# Patient Record
Sex: Female | Born: 1975 | Race: Black or African American | Hispanic: No | Smoking: Never smoker
Health system: Southern US, Community
[De-identification: ages and names within clinical notes are randomized; demographics above are authoritative.]

## PROBLEM LIST (undated history)

## (undated) DIAGNOSIS — E7800 Pure hypercholesterolemia, unspecified: Secondary | ICD-10-CM

## (undated) HISTORY — PX: TUMOR REMOVAL: SHX12

## (undated) HISTORY — PX: BREAST CYST ASPIRATION: SHX578

## (undated) HISTORY — DX: Pure hypercholesterolemia, unspecified: E78.00

---

## 1998-05-14 ENCOUNTER — Other Ambulatory Visit: Admission: RE | Admit: 1998-05-14 | Discharge: 1998-05-14 | Payer: Self-pay | Admitting: Obstetrics and Gynecology

## 1998-06-18 ENCOUNTER — Encounter: Payer: Self-pay | Admitting: Urology

## 1998-06-18 ENCOUNTER — Ambulatory Visit (HOSPITAL_COMMUNITY): Admission: RE | Admit: 1998-06-18 | Discharge: 1998-06-18 | Payer: Self-pay | Admitting: Urology

## 1998-12-23 ENCOUNTER — Other Ambulatory Visit: Admission: RE | Admit: 1998-12-23 | Discharge: 1998-12-23 | Payer: Self-pay | Admitting: Urology

## 1999-04-13 ENCOUNTER — Other Ambulatory Visit: Admission: RE | Admit: 1999-04-13 | Discharge: 1999-04-13 | Payer: Self-pay | Admitting: Obstetrics and Gynecology

## 2002-07-02 ENCOUNTER — Other Ambulatory Visit: Admission: RE | Admit: 2002-07-02 | Discharge: 2002-07-02 | Payer: Self-pay | Admitting: Obstetrics and Gynecology

## 2003-07-07 ENCOUNTER — Other Ambulatory Visit: Admission: RE | Admit: 2003-07-07 | Discharge: 2003-07-07 | Payer: Self-pay | Admitting: Obstetrics and Gynecology

## 2003-11-23 ENCOUNTER — Ambulatory Visit: Payer: Self-pay | Admitting: Psychiatry

## 2003-11-23 ENCOUNTER — Other Ambulatory Visit (HOSPITAL_COMMUNITY): Admission: RE | Admit: 2003-11-23 | Discharge: 2004-02-21 | Payer: Self-pay | Admitting: Psychiatry

## 2004-08-18 ENCOUNTER — Other Ambulatory Visit: Admission: RE | Admit: 2004-08-18 | Discharge: 2004-08-18 | Payer: Self-pay | Admitting: Obstetrics and Gynecology

## 2004-08-31 ENCOUNTER — Ambulatory Visit: Payer: Self-pay | Admitting: Internal Medicine

## 2004-10-03 ENCOUNTER — Ambulatory Visit: Payer: Self-pay | Admitting: Internal Medicine

## 2004-10-10 ENCOUNTER — Ambulatory Visit: Payer: Self-pay | Admitting: Internal Medicine

## 2005-08-23 ENCOUNTER — Other Ambulatory Visit: Admission: RE | Admit: 2005-08-23 | Discharge: 2005-08-23 | Payer: Self-pay | Admitting: Obstetrics and Gynecology

## 2006-12-02 ENCOUNTER — Emergency Department (HOSPITAL_COMMUNITY): Admission: EM | Admit: 2006-12-02 | Discharge: 2006-12-02 | Payer: Self-pay | Admitting: Emergency Medicine

## 2007-12-24 ENCOUNTER — Telehealth: Payer: Self-pay | Admitting: Internal Medicine

## 2008-12-25 ENCOUNTER — Encounter: Admission: RE | Admit: 2008-12-25 | Discharge: 2008-12-25 | Payer: Self-pay | Admitting: Family Medicine

## 2011-01-06 LAB — URINALYSIS, ROUTINE W REFLEX MICROSCOPIC
Bilirubin Urine: NEGATIVE
Glucose, UA: NEGATIVE
Ketones, ur: NEGATIVE
Leukocytes, UA: NEGATIVE
Nitrite: NEGATIVE
Protein, ur: NEGATIVE
Specific Gravity, Urine: 1.02
Urobilinogen, UA: 0.2
pH: 6.5

## 2011-01-06 LAB — WET PREP, GENITAL
Trich, Wet Prep: NONE SEEN
Yeast Wet Prep HPF POC: NONE SEEN

## 2011-01-06 LAB — PREGNANCY, URINE: Preg Test, Ur: NEGATIVE

## 2011-01-06 LAB — URINE MICROSCOPIC-ADD ON

## 2012-01-01 ENCOUNTER — Ambulatory Visit: Payer: Self-pay | Admitting: Obstetrics and Gynecology

## 2012-03-09 ENCOUNTER — Ambulatory Visit (INDEPENDENT_AMBULATORY_CARE_PROVIDER_SITE_OTHER): Payer: BC Managed Care – PPO | Admitting: Emergency Medicine

## 2012-03-09 VITALS — BP 119/73 | HR 97 | Temp 98.0°F | Resp 18 | Ht 67.0 in | Wt 151.0 lb

## 2012-03-09 DIAGNOSIS — J018 Other acute sinusitis: Secondary | ICD-10-CM

## 2012-03-09 DIAGNOSIS — J209 Acute bronchitis, unspecified: Secondary | ICD-10-CM

## 2012-03-09 MED ORDER — HYDROCOD POLST-CHLORPHEN POLST 10-8 MG/5ML PO LQCR: 5.0000 mL | Freq: Two times a day (BID) | ORAL | Status: DC | PRN

## 2012-03-09 MED ORDER — PSEUDOEPHEDRINE-GUAIFENESIN ER 60-600 MG PO TB12: 1.0000 | ORAL_TABLET | Freq: Two times a day (BID) | ORAL | Status: AC

## 2012-03-09 MED ORDER — AMOXICILLIN-POT CLAVULANATE 875-125 MG PO TABS: 1.0000 | ORAL_TABLET | Freq: Two times a day (BID) | ORAL | Status: DC

## 2012-03-09 NOTE — Progress Notes (Signed)
[facility] [address] [place] [address] [phone]  Date:  03/09/2012   Name:  Faith Hodges   DOB:  1976/03/19   MRN:  [id number]  PCP:  No primary provider on file.    Chief Complaint: URI   History of Present Illness:  Faith Hodges is a 36 y.o. very pleasant female patient who presents with the following:  Ill all week with muscle aches and joint pains.  Has chills, no fever documented.  Has nasal congestion and post nasal purulent drainage and a cough that has been largely dry but has morning purulent sputum.  No nausea or vomiting.  No wheezing or shortness of breath.  Has a headache no sore throat.  Not improved on OTC medications.  Cough is interfering with sleep.  Non smoker.  There is no problem list on file for this patient.   Past Medical History  Diagnosis Date  . Elevated cholesterol     Past Surgical History  Procedure Date  . Tumor removal     lt knee    History  Substance Use Topics  . Smoking status: Never Smoker   . Smokeless tobacco: Never Used  . Alcohol Use: No    No family history on file.  Allergies  Allergen Reactions  . Shellfish Allergy     Medication list has been reviewed and updated.  No current outpatient prescriptions on file prior to visit.    Review of Systems:  As per HPI, otherwise negative.  Marland Kitchen  Physical Examination: Filed Vitals:   03/09/12 1118  BP: 119/73  Pulse: 97  Temp: 98 F (36.7 C)  Resp: 18   Filed Vitals:   03/09/12 1118  Height: 5\' 7"  (1.702 m)  Weight: 151 lb (68.493 kg)   Body mass index is 23.65 kg/(m^2). Ideal Body Weight: Weight in (lb) to have BMI = 25: 159.3   GEN: WDWN, NAD, Non-toxic, A & O x 3  No rash or sepsis or shortness of breath HEENT: Atraumatic, Normocephalic. Neck supple. No masses, No LAD.  Oropharynx negative Ears and Nose: No external deformity.  TM negative right left obscured by cerumen.   CV: RRR, No M/G/R. No JVD. No thrill. No extra  heart sounds. PULM: CTA B, no wheezes, crackles, rhonchi. No retractions. No resp. distress. No accessory muscle use. ABD: S, NT, ND, +BS. No rebound. No HSM. EXTR: No c/c/e NEURO Normal gait.  PSYCH: Normally interactive. Conversant. Not depressed or anxious appearing.  Calm demeanor.    Assessment and Plan: Sinusitis Bronchitis augmentin mucinex d tussionex Follow up as needed  Carmelina Dane, MD

## 2012-03-26 ENCOUNTER — Ambulatory Visit (INDEPENDENT_AMBULATORY_CARE_PROVIDER_SITE_OTHER): Payer: BC Managed Care – PPO | Admitting: Obstetrics and Gynecology

## 2012-03-26 ENCOUNTER — Encounter: Payer: Self-pay | Admitting: Obstetrics and Gynecology

## 2012-03-26 VITALS — BP 110/62 | HR 68 | Resp 16 | Ht 67.0 in | Wt 156.0 lb

## 2012-03-26 DIAGNOSIS — Z01419 Encounter for gynecological examination (general) (routine) without abnormal findings: Secondary | ICD-10-CM

## 2012-03-26 DIAGNOSIS — N943 Premenstrual tension syndrome: Secondary | ICD-10-CM

## 2012-03-26 NOTE — Progress Notes (Signed)
AEX Last Pap: 01/13/2010 WNL: Yes Regular Periods:yes Contraception: None  Monthly Breast exam:yes Tetanus<46yrs:yes Nl.Bladder Function:yes Daily BMs:yes Healthy Diet:yes Calcium:no Mammogram:no Date of Mammogram: N/A Exercise:yes Have often Exercise: 3-4 x wk Seatbelt: yes Abuse at home: no Stressful work:yes Sigmoid-colonoscopy: 2006 Benign Polyps Bone Density: No PCP: Laurann Montana Change in PMH: Bursitis in Left shoulder Change in ZOX:WRUE  Subjective:    Faith Hodges is a 36 y.o. female, No obstetric history on file., who presents for an annual exam. Only complaint is PMS moodiness 5 days prior to menses.  Has had difficulty with this previously.  Also had problems with depression in past. May want to consider pregnancy    History   Social History  . Marital Status: Legally Separated    Spouse Name: N/A    Number of Children: N/A  . Years of Education: N/A   Social History Main Topics  . Smoking status: Never Smoker   . Smokeless tobacco: Never Used  . Alcohol Use: No  . Drug Use: No  . Sexually Active: Yes    Birth Control/ Protection: None   Other Topics Concern  . None   Social History Narrative  . None    Menstrual cycle:   LMP: Patient's last menstrual period was 03/05/2012.           Cycle: regular No IM bleeding. No cramps  The following portions of the patient's history were reviewed and updated as appropriate: allergies, current medications, past family history, past medical history, past social history, past surgical history and problem list.  Review of Systems Pertinent items are noted in HPI. Breast:Negative for breast lump,nipple discharge or nipple retraction Gastrointestinal: Negative for abdominal pain, change in bowel habits or rectal bleeding Urinary:negative   Objective:    LMP 03/05/2012    Weight:  Wt Readings from Last 1 Encounters:  03/09/12 151 lb (68.493 kg)          BMI: There is no height or weight on file to  calculate BMI.  General Appearance: Alert, appropriate appearance for age. No acute distress HEENT: Grossly normal Neck / Thyroid: Supple, no masses, nodes or enlargement Lungs: clear to auscultation bilaterally Back: No CVA tenderness Breast Exam: No masses or nodes.No dimpling, nipple retraction or discharge. Cardiovascular: Regular rate and rhythm. S1, S2, no murmur Gastrointestinal: Soft, non-tender, no masses or organomegaly Pelvic Exam: Vulva and vagina appear normal. Bimanual exam reveals normal uterus and adnexa. Rectovaginal: normal rectal, no masses Lymphatic Exam: Non-palpable nodes in neck, clavicular, axillary, or inguinal regions Skin: no rash or abnormalities Neurologic: Normal gait and speech, no tremor  Psychiatric: Alert and oriented, appropriate affect.   Wet Prep:not applicable Urinalysis:not applicable UPT: Not done   Assessment:    Normal gyn exam PMS    Plan:  Written suggestions for managing PMS  Without medication initially.  Pt will call if sx persist  pap smear due 2014 return annually or prn STD screening: declined Contraception:no method: declines  Dierdre Forth MD

## 2012-03-26 NOTE — Patient Instructions (Signed)
Premenstrual Syndrome Premenstrual syndrome (PMS) or premenstrual disphoric disorder (PMDD) is a mix of emotional and clinical symptoms. PMS occurs 10 to 14 days before the start of a menstrual period. Common symptoms include pelvic pain, headache and mood changes. Most women have PMS to some degree.  CAUSES  The cause is unknown. There is evidence that it is related to the female hormones during the second half of the menstrual cycle. These hormones fluctuate and are thought to affect chemicals in the brain (serotonin) that can influence a person's mood. SYMPTOMS  Symptoms may include any of the following:  Headache.  Swelling of hands and feet.  Abdominal bloating.  Tiredness.  Breast tenderness.  Depression.  Crying spells.  Anxiety.  Irritability.  Confusion.  Joint and muscle pains.  Forgetfulness.  Withdrawal from family, friends and activities. DIAGNOSIS  Diagnosis is made by your caregiver who will ask you questions about the kind of symptoms you are having, when they occur and what may bring them on. If your are having any of the symptoms listed above that occur 10 to 14 days before your menstrual period, it is strong evidence you have PMS. TREATMENT   Only take over-the-counter or prescription medicines for pain, discomfort or fever as directed by your caregiver.  Oral contraceptives.  Hormone therapy.  Medications that slow down the production of serotonin in the brain (fluoxetine, sertraline and others).  Diuretics. These get rid of extra fluid from your body.  Anti-depression medication when necessary.  Surgery to remove both ovaries. This is a last resort and if no further pregnancies are wanted.  Consider counseling or joining a PMS therapy support group. HOME CARE INSTRUCTIONS   Exercise regularly as suggested by your caregiver. Exercise especially before your menstrual period.  Eat a regular, well-balanced diet rich in carbohydrates.  Restrict  or eliminate caffeine, alcohol and tobacco consumption.  Be sure to get enough sleep. Practice relaxation techniques.  Drink 64 oz. fluids per day. This is 8 glasses, 8 oz. each. It is best to drink water.  Eliminate known stressors in your life.  Attend relationship or parenting counseling, if needed.  Take a multi-vitamin in the recommended daily dosages.  Calcium, magnesium, vitamin B6 and vitamin E are some times helpful for PMS symptoms.  Take medications as suggested by your caregiver. SEEK MEDICAL CARE IF:   You need medication for excessive swelling, depression, severe headaches, or because you cannot sleep.  You need help from your caregiver to help you decide if you need to have your ovaries removed because none of your treatment is helping you. Document Released: 03/10/2000 Document Revised: 06/05/2011 Document Reviewed: 07/31/2011 [facility] Patient Information 2013 [place], [place].

## 2013-07-29 ENCOUNTER — Other Ambulatory Visit: Payer: Self-pay | Admitting: Obstetrics and Gynecology

## 2013-07-29 DIAGNOSIS — Z1231 Encounter for screening mammogram for malignant neoplasm of breast: Secondary | ICD-10-CM

## 2013-08-12 ENCOUNTER — Ambulatory Visit: Payer: Self-pay

## 2013-08-27 ENCOUNTER — Ambulatory Visit
Admission: RE | Admit: 2013-08-27 | Discharge: 2013-08-27 | Disposition: A | Discharge status: Home / Self Care | Payer: No Typology Code available for payment source | Source: Ambulatory Visit | Attending: Obstetrics and Gynecology | Admitting: Obstetrics and Gynecology

## 2013-08-27 DIAGNOSIS — Z1231 Encounter for screening mammogram for malignant neoplasm of breast: Secondary | ICD-10-CM

## 2013-09-10 ENCOUNTER — Ambulatory Visit (INDEPENDENT_AMBULATORY_CARE_PROVIDER_SITE_OTHER): Payer: BC Managed Care – PPO | Admitting: Emergency Medicine

## 2013-09-10 VITALS — BP 104/66 | HR 76 | Temp 98.1°F | Resp 12 | Ht 67.0 in | Wt 155.4 lb

## 2013-09-10 DIAGNOSIS — H6980 Other specified disorders of Eustachian tube, unspecified ear: Secondary | ICD-10-CM

## 2013-09-10 MED ORDER — PSEUDOEPHEDRINE-GUAIFENESIN ER 60-600 MG PO TB12: 1.0000 | ORAL_TABLET | Freq: Two times a day (BID) | ORAL | Status: AC

## 2013-09-10 MED ORDER — TRIAMCINOLONE ACETONIDE 55 MCG/ACT NA AERO: 2.0000 | INHALATION_SPRAY | Freq: Every day | NASAL | Status: DC

## 2013-09-10 NOTE — Progress Notes (Signed)
[facility] [address], [place] [id number] [phone]  Date:  09/10/2013   Name:  Faith Hodges   DOB:  04/09/1975   MRN:  [id number]  PCP:  Cala BradfordWHITE,CYNTHIA S, MD    Chief Complaint: Otalgia   History of Present Illness:  Faith Hodges is a 38 y.o. very pleasant female patient who presents with the following:  Pain and decreased ear on left today. Concerned it may be blocked with wax.  Uses ear plugs at night due husband's snoring.  No fever or chills, coryza or cough.  No improvement with over the counter medications or other home remedies. Denies other complaint or health concern today.   Patient Active Problem List   Diagnosis Date Noted  . PMS (premenstrual syndrome) 03/26/2012    Past Medical History  Diagnosis Date  . Elevated cholesterol     Past Surgical History  Procedure Laterality Date  . Tumor removal      lt knee    History  Substance Use Topics  . Smoking status: Never Smoker   . Smokeless tobacco: Never Used  . Alcohol Use: No    Family History  Problem Relation Age of Onset  . Heart disease Mother     Allergies  Allergen Reactions  . Shellfish Allergy     Medication list has been reviewed and updated.  No current outpatient prescriptions on file prior to visit.   No current facility-administered medications on file prior to visit.    Review of Systems:  As per HPI, otherwise negative.    Physical Examination: Filed Vitals:   09/10/13 2025  BP: 104/66  Pulse: 76  Temp: 98.1 F (36.7 C)  Resp: 12   Filed Vitals:   09/10/13 2025  Height: 5\' 7"  (1.702 m)  Weight: 155 lb 6.4 oz (70.489 kg)   Body mass index is 24.33 kg/(m^2). Ideal Body Weight: Weight in (lb) to have BMI = 25: 159.3  GEN: WDWN, NAD, Non-toxic, A & O x 3 HEENT: Atraumatic, Normocephalic. Neck supple. No masses, No LAD. Ears and Nose: No external deformity.  Left drum retracted CV: RRR, No M/G/R. No JVD. No thrill. No extra  heart sounds. PULM: CTA B, no wheezes, crackles, rhonchi. No retractions. No resp. distress. No accessory muscle use. ABD: S, NT, ND, +BS. No rebound. No HSM. EXTR: No c/c/e NEURO Normal gait.  PSYCH: Normally interactive. Conversant. Not depressed or anxious appearing.  Calm demeanor.    Assessment and Plan: Eustachian tube dysfunction mucinex d  nasacort   Signed,  Phillips OdorJeffery Anderson, MD

## 2013-09-10 NOTE — Patient Instructions (Signed)
Eppworth Sleepiness Scale       Barotitis Media Barotitis media is inflammation of your middle ear. This occurs when the auditory tube (eustachian tube) leading from the back of your nose (nasopharynx) to your eardrum is blocked. This blockage may result from a cold, environmental allergies, or an upper respiratory infection. Unresolved barotitis media may lead to damage or hearing loss (barotrauma), which may become permanent. HOME CARE INSTRUCTIONS   Use medicines as recommended by your health care provider. Over-the-counter medicines will help unblock the canal and can help during times of air travel.  Do not put anything into your ears to clean or unplug them. Eardrops will not be helpful.  Do not swim, dive, or fly until your health care provider says it is all right to do so. If these activities are necessary, chewing gum with frequent, forceful swallowing may help. It is also helpful to hold your nose and gently blow to pop your ears for equalizing pressure changes. This forces air into the eustachian tube.  Only take over-the-counter or prescription medicines for pain, discomfort, or fever as directed by your health care provider.  A decongestant may be helpful in decongesting the middle ear and make pressure equalization easier. SEEK MEDICAL CARE IF:  You experience a serious form of dizziness in which you feel as if the room is spinning and you feel nauseated (vertigo).  Your symptoms only involve one ear. SEEK IMMEDIATE MEDICAL CARE IF:   You develop a severe headache, dizziness, or severe ear pain.  You have bloody or pus-like drainage from your ears.  You develop a fever.  Your problems do not improve or become worse. MAKE SURE YOU:   Understand these instructions.  Will watch your condition.  Will get help right away if you are not doing well or get worse. Document Released: 03/10/2000 Document Revised: 01/01/2013 Document Reviewed: 10/08/2012 [facility] Patient  Information 2015 [place], [place]. This information is not intended to replace advice given to you by your health care provider. Make sure you discuss any questions you have with your health care provider.

## 2013-11-07 ENCOUNTER — Other Ambulatory Visit: Payer: Self-pay | Admitting: Obstetrics and Gynecology

## 2013-11-07 DIAGNOSIS — N63 Unspecified lump in unspecified breast: Secondary | ICD-10-CM

## 2013-11-13 ENCOUNTER — Other Ambulatory Visit: Payer: Self-pay | Admitting: Obstetrics and Gynecology

## 2013-11-13 ENCOUNTER — Ambulatory Visit
Admission: RE | Admit: 2013-11-13 | Discharge: 2013-11-13 | Disposition: A | Discharge status: Home / Self Care | Payer: No Typology Code available for payment source | Source: Ambulatory Visit | Attending: Obstetrics and Gynecology | Admitting: Obstetrics and Gynecology

## 2013-11-13 ENCOUNTER — Other Ambulatory Visit: Payer: BC Managed Care – PPO

## 2013-11-13 DIAGNOSIS — N63 Unspecified lump in unspecified breast: Secondary | ICD-10-CM

## 2014-07-24 ENCOUNTER — Other Ambulatory Visit (HOSPITAL_COMMUNITY): Payer: Self-pay | Admitting: Family Medicine

## 2014-07-24 DIAGNOSIS — R079 Chest pain, unspecified: Secondary | ICD-10-CM

## 2014-08-04 ENCOUNTER — Telehealth (HOSPITAL_COMMUNITY): Payer: Self-pay

## 2014-08-04 NOTE — Telephone Encounter (Signed)
Encounter complete. 

## 2014-08-06 ENCOUNTER — Ambulatory Visit (HOSPITAL_COMMUNITY)
Admission: RE | Admit: 2014-08-06 | Discharge: 2014-08-06 | Disposition: A | Discharge status: Home / Self Care | Payer: BLUE CROSS/BLUE SHIELD | Source: Ambulatory Visit | Attending: Cardiovascular Disease | Admitting: Cardiovascular Disease

## 2014-08-06 DIAGNOSIS — R079 Chest pain, unspecified: Secondary | ICD-10-CM

## 2014-08-06 LAB — EXERCISE TOLERANCE TEST
CHL CUP RESTING HR STRESS: 94 {beats}/min
CHL CUP STRESS STAGE 1 DBP: 83 mmHg
CHL CUP STRESS STAGE 1 GRADE: 0 %
CHL CUP STRESS STAGE 2 SPEED: 0.8 mph
CHL CUP STRESS STAGE 3 HR: 96 {beats}/min
CHL CUP STRESS STAGE 3 SPEED: 1 mph
CHL CUP STRESS STAGE 4 GRADE: 10 %
CHL CUP STRESS STAGE 4 SBP: 136 mmHg
CHL CUP STRESS STAGE 4 SPEED: 1.7 mph
CHL CUP STRESS STAGE 5 DBP: 78 mmHg
CHL CUP STRESS STAGE 5 HR: 125 {beats}/min
CHL CUP STRESS STAGE 5 SBP: 133 mmHg
CHL CUP STRESS STAGE 6 HR: 148 {beats}/min
CHL CUP STRESS STAGE 6 SBP: 135 mmHg
CHL CUP STRESS STAGE 7 DBP: 79 mmHg
CHL CUP STRESS STAGE 7 GRADE: 16 %
CHL CUP STRESS STAGE 7 SBP: 201 mmHg
CHL CUP STRESS STAGE 7 SPEED: 4.2 mph
CHL CUP STRESS STAGE 8 DBP: 77 mmHg
CHL CUP STRESS STAGE 8 SBP: 150 mmHg
CHL CUP STRESS STAGE 8 SPEED: 0 mph
CHL CUP STRESS STAGE 9 SPEED: 0 mph
CSEPEW: 13.4 METS
Exercise duration (min): 11 min
MPHR: 179 {beats}/min
Peak BP: 201 mmHg
Peak HR: 176 {beats}/min
Percent HR: 100 %
Percent of predicted max HR: 98 %
RPE: 35376
Stage 1 HR: 92 {beats}/min
Stage 1 SBP: 114 mmHg
Stage 1 Speed: 0 mph
Stage 2 Grade: 0 %
Stage 2 HR: 96 {beats}/min
Stage 3 Grade: 0.1 %
Stage 4 DBP: 78 mmHg
Stage 4 HR: 112 {beats}/min
Stage 5 Grade: 12 %
Stage 5 Speed: 2.5 mph
Stage 6 DBP: 77 mmHg
Stage 6 Grade: 14 %
Stage 6 Speed: 3.4 mph
Stage 7 HR: 176 {beats}/min
Stage 8 Grade: 0 %
Stage 8 HR: 155 {beats}/min
Stage 9 Grade: 0 %
Stage 9 HR: 104 {beats}/min

## 2014-09-29 ENCOUNTER — Ambulatory Visit (INDEPENDENT_AMBULATORY_CARE_PROVIDER_SITE_OTHER): Payer: BLUE CROSS/BLUE SHIELD | Admitting: Family Medicine

## 2014-09-29 VITALS — BP 140/78 | HR 89 | Temp 98.2°F | Resp 16 | Ht 66.5 in | Wt 153.0 lb

## 2014-09-29 DIAGNOSIS — H9192 Unspecified hearing loss, left ear: Secondary | ICD-10-CM

## 2014-09-29 DIAGNOSIS — H6982 Other specified disorders of Eustachian tube, left ear: Secondary | ICD-10-CM | POA: Diagnosis not present

## 2014-09-29 DIAGNOSIS — M7711 Lateral epicondylitis, right elbow: Secondary | ICD-10-CM

## 2014-09-29 DIAGNOSIS — L723 Sebaceous cyst: Secondary | ICD-10-CM | POA: Diagnosis not present

## 2014-09-29 MED ORDER — PREDNISONE 20 MG PO TABS: ORAL_TABLET | ORAL | Status: DC

## 2014-09-29 NOTE — Progress Notes (Signed)
Subjective:   This chart was scribed for Faith SimmerKristi Smith, MD by Jarvis Morganaylor Ferguson, ED Scribe. This patient was seen in Room 9 and the patient's care was started at 7:55 PM.   Patient ID: Faith Hodges, female    DOB: 06/12/1975, 39 y.o.   MRN: [id number]  09/29/2014  Ear Fullness; Cyst; and Elbow Injury   HPI  HPI Comments: Faith Hodges is a 39 y.o. female who [facility] with multiple complaints:  Pt is complaining of muffled hearing in her left ear for 5 days. She denies any issues with her right ear. She reports associated tinnitus. Pt has been taking Claritin D for three days and using solution to clear out ears with no relief. She denies any recent upper respiratory illness. She admits she sleeps with ear plugs at night. She denies any otalgia, ear drainage, fever, chills, sweats, congestion, sore throat or cough.  Hearing is 15% improved from onset.  She has a second complaint of pain in her right elbow for 2 weeks. Pt states the pain began after exercising on a rowing machine. Pt notes the pain radiates from her right elbow down to her right wrist. She notes a history of tendonitis in her right arm and has a brace for the arm that she has been wearing. She has also been icing the right elbow and wrist with no significant relief. Pt admits to moderate relief after getting a massage last week but states the pain returned. She denies any swelling. Pt prefers to avoid NSAIDs so has not taken any for this injury.  Pt has a third complaint of a cyst to her upper back which has had gradually worsening tenderness over the past 2 weeks. Pt has an appt in 2 days with a dermatologist. She previously saw Dr.Tafeen who recommended surgery and she wants a second opinion. She denies any drainage.  Area has become more tender in the past two weeks. It is more inflamed from baseline.   Review of Systems  Constitutional: Negative for fever, chills, diaphoresis and  fatigue.  HENT: Positive for hearing loss (muffled hearing in left ear) and tinnitus. Negative for congestion, ear discharge, ear pain, postnasal drip, rhinorrhea, sinus pressure, sneezing, sore throat, trouble swallowing and voice change.   Respiratory: Negative for cough and shortness of breath.   Musculoskeletal: Positive for myalgias and arthralgias. Negative for joint swelling.  Skin: Positive for color change (cyst to upper back).  Neurological: Negative for weakness and numbness.    Past Medical History  Diagnosis Date  . Elevated cholesterol    Past Surgical History  Procedure Laterality Date  . Tumor removal      lt knee   Allergies  Allergen Reactions  . Shellfish Allergy   . Latex Rash   History   Social History  . Marital Status: Legally Separated    Spouse Name: N/A  . Number of Children: N/A  . Years of Education: N/A   Occupational History  . Not on file.   Social History Main Topics  . Smoking status: Never Smoker   . Smokeless tobacco: Never Used  . Alcohol Use: No  . Drug Use: No  . Sexual Activity: Yes    Birth Control/ Protection: None   Other Topics Concern  . Not on file   Social History Narrative        Objective:    Triage Vitals: BP 140/78 mmHg  Pulse 89  Temp(Src) 98.2  F (36.8 C) (Oral)  Resp 16  Ht 5' 6.5" (1.689 m)  Wt 153 lb (69.4 kg)  BMI 24.33 kg/m2  SpO2 98%  LMP 09/18/2014  Physical Exam  Constitutional: She is oriented to person, place, and time. She appears well-developed and well-nourished. No distress.  HENT:  Head: Normocephalic and atraumatic.  Right Ear: Tympanic membrane, external ear and ear canal normal. Tympanic membrane is not injected, not scarred, not perforated, not erythematous, not retracted and not bulging. No middle ear effusion.  Left Ear: Tympanic membrane, external ear and ear canal normal. Tympanic membrane is not injected, not scarred, not perforated, not erythematous, not retracted and not  bulging.  No middle ear effusion.  Mouth/Throat: Oropharynx is clear and moist.  Eyes: Conjunctivae and EOM are normal. Pupils are equal, round, and reactive to light.  Neck: Normal range of motion. Neck supple.  Cardiovascular: Normal rate, regular rhythm and normal heart sounds.  Exam reveals no gallop and no friction rub.   No murmur heard. Pulmonary/Chest: Effort normal and breath sounds normal. She has no wheezes. She has no rales.  Musculoskeletal:       Right elbow: She exhibits normal range of motion, no swelling, no effusion, no deformity and no laceration. Tenderness found. Lateral epicondyle tenderness noted. No medial epicondyle and no olecranon process tenderness noted.       Right wrist: Normal. She exhibits normal range of motion, no tenderness, no bony tenderness, no swelling and no effusion.       Right forearm: She exhibits no tenderness, no bony tenderness, no swelling, no edema, no deformity and no laceration.       Right hand: Normal. She exhibits normal range of motion and no tenderness. Normal sensation noted. Normal strength noted.  Lymphadenopathy:    She has no cervical adenopathy.  Neurological: She is alert and oriented to person, place, and time.  Skin: Skin is warm and dry. She is not diaphoretic. There is erythema.  Sebaceous cyst noted to upper back, 2 cm in diameter with mild erythema; +TTP.  Psychiatric: She has a normal mood and affect. Her behavior is normal.  Nursing note and vitals reviewed.       Assessment & Plan:   1. Decreased hearing, left   2. Lateral epicondylitis, right   3. Sebaceous cyst   4. Eustachian tube dysfunction, left    1. L hearing loss; New. Moderate hearing loss on audiometry.  Refer to ENT; treat empirically with Prednisone due to moderate loss.  Recommend continuing Claritin D daily and adding Flonase daily for underlying ETD. 2.  R lateral epicondylitis: New.  Recommend icing, home exercise program, wrist splint during the  day; rx for Prednisone provided. 3. Sebaceous cyst back: New.  Recommend keeping appointment with dermatology this week for surgical resection.  Meds ordered this encounter  Medications  . predniSONE (DELTASONE) 20 MG tablet    Sig: Three tablets daily x 1 day then two tablets daily x 5 days then one tablet daily x 5 days    Dispense:  18 tablet    Refill:  0    No Follow-up on file.  I personally performed the services described in this documentation, which was scribed in my presence. The recorded information has been reviewed and considered.  Kristi Paulita Fujita, M.D. [facility] [address] [place], [place]  [address] [phone] phone [phone] fax

## 2014-09-29 NOTE — Patient Instructions (Addendum)
1.  Start FLONASE two sprays in each nostril daily.  2. Continue Claritin D once daily for the next ten days. 3.  Do not put anything in the left ear.     Lateral Epicondylitis (Tennis Elbow) with Rehab Lateral epicondylitis involves inflammation and pain around the outer portion of the elbow. The pain is caused by inflammation of the tendons in the forearm that bring back (extend) the wrist. Lateral epicondylitis is also called tennis elbow, because it is very common in tennis players. However, it may occur in any individual who extends the wrist repetitively. If lateral epicondylitis is left untreated, it may become a chronic problem. SYMPTOMS   Pain, tenderness, and inflammation on the outer (lateral) side of the elbow.  Pain or weakness with gripping activities.  Pain that increases with wrist-twisting motions (playing tennis, using a screwdriver, opening a door or a jar).  Pain with lifting objects, including a coffee cup. CAUSES  Lateral epicondylitis is caused by inflammation of the tendons that extend the wrist. Causes of injury may include:  Repetitive stress and strain on the muscles and tendons that extend the wrist.  Sudden change in activity level or intensity.  Incorrect grip in racquet sports.  Incorrect grip size of racquet (often too large).  Incorrect hitting position or technique (usually backhand, leading with the elbow).  Using a racket that is too heavy. RISK INCREASES WITH:  Sports or occupations that require repetitive and/or strenuous forearm and wrist movements (tennis, squash, racquetball, carpentry).  Poor wrist and forearm strength and flexibility.  Failure to warm up properly before activity.  Resuming activity before healing, rehabilitation, and conditioning are complete. PREVENTION   Warm up and stretch properly before activity.  Maintain physical fitness:  Strength, flexibility, and endurance.  Cardiovascular fitness.  Wear and use  properly fitted equipment.  Learn and use proper technique and have a coach correct improper technique.  Wear a tennis elbow (counterforce) brace. PROGNOSIS  The course of this condition depends on the degree of the injury. If treated properly, acute cases (symptoms lasting less than 4 weeks) are often resolved in 2 to 6 weeks. Chronic (longer lasting cases) often resolve in 3 to 6 months but may require physical therapy. RELATED COMPLICATIONS   Frequently recurring symptoms, resulting in a chronic problem. Properly treating the problem the first time decreases frequency of recurrence.  Chronic inflammation, scarring tendon degeneration, and partial tendon tear, requiring surgery.  Delayed healing or resolution of symptoms. TREATMENT  Treatment first involves the use of ice and medicine to reduce pain and inflammation. Strengthening and stretching exercises may help reduce discomfort if performed regularly. These exercises may be performed at home if the condition is an acute injury. Chronic cases may require a referral to a physical therapist for evaluation and treatment. Your caregiver may advise a corticosteroid injection to help reduce inflammation. Rarely, surgery is needed. MEDICATION  If pain medicine is needed, nonsteroidal anti-inflammatory medicines (aspirin and ibuprofen), or other minor pain relievers (acetaminophen), are often advised.  Do not take pain medicine for 7 days before surgery.  Prescription pain relievers may be given, if your caregiver thinks they are needed. Use only as directed and only as much as you need.  Corticosteroid injections may be recommended. These injections should be reserved only for the most severe cases, because they can only be given a certain number of times. HEAT AND COLD  Cold treatment (icing) should be applied for 10 to 15 minutes every 2 to 3  hours for inflammation and pain, and immediately after activity that aggravates your symptoms. Use  ice packs or an ice massage.  Heat treatment may be used before performing stretching and strengthening activities prescribed by your caregiver, physical therapist, or athletic trainer. Use a heat pack or a warm water soak. SEEK MEDICAL CARE IF: Symptoms get worse or do not improve in 2 weeks, despite treatment. EXERCISES  RANGE OF MOTION (ROM) AND STRETCHING EXERCISES - Epicondylitis, Lateral (Tennis Elbow) These exercises may help you when beginning to rehabilitate your injury. Your symptoms may go away with or without further involvement from your physician, physical therapist, or athletic trainer. While completing these exercises, remember:   Restoring tissue flexibility helps normal motion to return to the joints. This allows healthier, less painful movement and activity.  An effective stretch should be held for at least 30 seconds.  A stretch should never be painful. You should only feel a gentle lengthening or release in the stretched tissue. RANGE OF MOTION - Wrist Flexion, Active-Assisted  Extend your right / left elbow with your fingers pointing down.*  Gently pull the back of your hand towards you, until you feel a gentle stretch on the top of your forearm.  Hold this position for __________ seconds. Repeat __________ times. Complete this exercise __________ times per day.  *If directed by your physician, physical therapist or athletic trainer, complete this stretch with your elbow bent, rather than extended. RANGE OF MOTION - Wrist Extension, Active-Assisted  Extend your right / left elbow and turn your palm upwards.*  Gently pull your palm and fingertips back, so your wrist extends and your fingers point more toward the ground.  You should feel a gentle stretch on the inside of your forearm.  Hold this position for __________ seconds. Repeat __________ times. Complete this exercise __________ times per day. *If directed by your physician, physical therapist or athletic  trainer, complete this stretch with your elbow bent, rather than extended. STRETCH - Wrist Flexion  Place the back of your right / left hand on a tabletop, leaving your elbow slightly bent. Your fingers should point away from your body.  Gently press the back of your hand down onto the table by straightening your elbow. You should feel a stretch on the top of your forearm.  Hold this position for __________ seconds. Repeat __________ times. Complete this stretch __________ times per day.  STRETCH - Wrist Extension   Place your right / left fingertips on a tabletop, leaving your elbow slightly bent. Your fingers should point backwards.  Gently press your fingers and palm down onto the table by straightening your elbow. You should feel a stretch on the inside of your forearm.  Hold this position for __________ seconds. Repeat __________ times. Complete this stretch __________ times per day.  STRENGTHENING EXERCISES - Epicondylitis, Lateral (Tennis Elbow) These exercises may help you when beginning to rehabilitate your injury. They may resolve your symptoms with or without further involvement from your physician, physical therapist, or athletic trainer. While completing these exercises, remember:   Muscles can gain both the endurance and the strength needed for everyday activities through controlled exercises.  Complete these exercises as instructed by your physician, physical therapist or athletic trainer. Increase the resistance and repetitions only as guided.  You may experience muscle soreness or fatigue, but the pain or discomfort you are trying to eliminate should never worsen during these exercises. If this pain does get worse, stop and make sure you are following the  directions exactly. If the pain is still present after adjustments, discontinue the exercise until you can discuss the trouble with your caregiver. STRENGTH - Wrist Flexors  Sit with your right / left forearm palm-up and  fully supported on a table or countertop. Your elbow should be resting below the height of your shoulder. Allow your wrist to extend over the edge of the surface.  Loosely holding a __________ weight, or a piece of rubber exercise band or tubing, slowly curl your hand up toward your forearm.  Hold this position for __________ seconds. Slowly lower the wrist back to the starting position in a controlled manner. Repeat __________ times. Complete this exercise __________ times per day.  STRENGTH - Wrist Extensors  Sit with your right / left forearm palm-down and fully supported on a table or countertop. Your elbow should be resting below the height of your shoulder. Allow your wrist to extend over the edge of the surface.  Loosely holding a __________ weight, or a piece of rubber exercise band or tubing, slowly curl your hand up toward your forearm.  Hold this position for __________ seconds. Slowly lower the wrist back to the starting position in a controlled manner. Repeat __________ times. Complete this exercise __________ times per day.  STRENGTH - Ulnar Deviators  Stand with a ____________________ weight in your right / left hand, or sit while holding a rubber exercise band or tubing, with your healthy arm supported on a table or countertop.  Move your wrist, so that your pinkie travels toward your forearm and your thumb moves away from your forearm.  Hold this position for __________ seconds and then slowly lower the wrist back to the starting position. Repeat __________ times. Complete this exercise __________ times per day STRENGTH - Radial Deviators  Stand with a ____________________ weight in your right / left hand, or sit while holding a rubber exercise band or tubing, with your injured arm supported on a table or countertop.  Raise your hand upward in front of you or pull up on the rubber tubing.  Hold this position for __________ seconds and then slowly lower the wrist back to  the starting position. Repeat __________ times. Complete this exercise __________ times per day. STRENGTH - Forearm Supinators   Sit with your right / left forearm supported on a table, keeping your elbow below shoulder height. Rest your hand over the edge, palm down.  Gently grip a hammer or a soup ladle.  Without moving your elbow, slowly turn your palm and hand upward to a "thumbs-up" position.  Hold this position for __________ seconds. Slowly return to the starting position. Repeat __________ times. Complete this exercise __________ times per day.  STRENGTH - Forearm Pronators   Sit with your right / left forearm supported on a table, keeping your elbow below shoulder height. Rest your hand over the edge, palm up.  Gently grip a hammer or a soup ladle.  Without moving your elbow, slowly turn your palm and hand upward to a "thumbs-up" position.  Hold this position for __________ seconds. Slowly return to the starting position. Repeat __________ times. Complete this exercise __________ times per day.  STRENGTH - Grip  Grasp a tennis ball, a dense sponge, or a large, rolled sock in your hand.  Squeeze as hard as you can, without increasing any pain.  Hold this position for __________ seconds. Release your grip slowly. Repeat __________ times. Complete this exercise __________ times per day.  STRENGTH - Elbow Extensors, Isometric  Stand or sit upright, on a firm surface. Place your right / left arm so that your palm faces your stomach, and it is at the height of your waist.  Place your opposite hand on the underside of your forearm. Gently push up as your right / left arm resists. Push as hard as you can with both arms, without causing any pain or movement at your right / left elbow. Hold this stationary position for __________ seconds. Gradually release the tension in both arms. Allow your muscles to relax completely before repeating. Document Released: 03/13/2005 Document  Revised: 07/28/2013 Document Reviewed: 06/25/2008 [facility] Patient Information 2015 [place], [place]. This information is not intended to replace advice given to you by your health care provider. Make sure you discuss any questions you have with your health care provider.

## 2014-12-14 ENCOUNTER — Ambulatory Visit (INDEPENDENT_AMBULATORY_CARE_PROVIDER_SITE_OTHER): Payer: BLUE CROSS/BLUE SHIELD | Admitting: Family Medicine

## 2014-12-14 VITALS — BP 100/64 | HR 73 | Temp 98.4°F | Resp 16 | Ht 67.0 in | Wt 158.4 lb

## 2014-12-14 DIAGNOSIS — B373 Candidiasis of vulva and vagina: Secondary | ICD-10-CM | POA: Diagnosis not present

## 2014-12-14 DIAGNOSIS — B3731 Acute candidiasis of vulva and vagina: Secondary | ICD-10-CM

## 2014-12-14 DIAGNOSIS — R300 Dysuria: Secondary | ICD-10-CM | POA: Diagnosis not present

## 2014-12-14 LAB — POCT UA - MICROSCOPIC ONLY
CASTS, UR, LPF, POC: NEGATIVE
CRYSTALS, UR, HPF, POC: NEGATIVE
YEAST UA: NEGATIVE

## 2014-12-14 LAB — POCT WET PREP WITH KOH
KOH Prep POC: POSITIVE
TRICHOMONAS UA: NEGATIVE
YEAST WET PREP PER HPF POC: POSITIVE

## 2014-12-14 LAB — POCT URINALYSIS DIPSTICK
BILIRUBIN UA: NEGATIVE
GLUCOSE UA: NEGATIVE
KETONES UA: 15
Leukocytes, UA: NEGATIVE
Nitrite, UA: NEGATIVE
PH UA: 6
Protein, UA: NEGATIVE
SPEC GRAV UA: 1.025
Urobilinogen, UA: 0.2

## 2014-12-14 MED ORDER — FLUCONAZOLE 150 MG PO TABS: 150.0000 mg | ORAL_TABLET | Freq: Once | ORAL | Status: DC

## 2014-12-14 NOTE — Progress Notes (Addendum)
Subjective:    Patient ID: Faith Hodges, female    DOB: [birth year], 39 y.o.   MRN: [id number] Chief Complaint  Patient presents with  . Vaginitis    burning, itching, Dx  x yesterday    HPI  Pt has a very distant history of recurrent yeast infections. They finally went away, after a lot of heartache and distress and have been gone for years but yesterday pt began to experience the deep internal itching. She can't tell if she is having any discharge as she is at the end of her menses so everything is pink. But pt very adament that she was never wrong about having a yeast infection in all the years prior.  She states that monistat never works for her so she hasn't tried it.  She had used terazosin yrs prev which occ worked but other than that she always had to take the diflucan.  She has no idea what triggered her current flair - she was in her gym clothes a little while longer than usual yest.  She also has changed her diet dramatically - much healthier and much less as she has been on a weight loss diet.  She is still taking high dose probiotics.  She is married and is not sexually active. She did have an episode of dysuria sev days prior which she was not sure what that was about. No recent swimming or baths, wearing breathable underwear. Very careful about the products she uses, no douching.  Past Medical History  Diagnosis Date  . Elevated cholesterol    No current outpatient prescriptions on file prior to visit.   No current facility-administered medications on file prior to visit.   Allergies  Allergen Reactions  . Shellfish Allergy   . Latex Rash     Review of Systems  Constitutional: Negative for fever, chills, diaphoresis, activity change, appetite change, fatigue and unexpected weight change.  Gastrointestinal: Negative for abdominal pain, diarrhea, constipation, blood in stool, anal bleeding and rectal pain.  Genitourinary: Positive for dysuria, vaginal bleeding,  vaginal discharge and vaginal pain. Negative for urgency, frequency, decreased urine volume, difficulty urinating, menstrual problem and pelvic pain.  Musculoskeletal: Negative for gait problem.  Skin: Negative for color change and rash.  Hematological: Negative for adenopathy.       Objective:  BP 100/64 mmHg  Pulse 73  Temp(Src) 98.4 F (36.9 C) (Oral)  Resp 16  Ht  (1.702 m)  Wt 158 lb 6.4 oz (71.85 kg)  BMI 24.80 kg/m2  SpO2 99%  LMP 12/09/2014  Physical Exam  Constitutional: She is oriented to person, place, and time. She appears well-developed and well-nourished. No distress.  HENT:  Head: Normocephalic and atraumatic.  Right Ear: External ear normal.  Eyes: Conjunctivae are normal. No scleral icterus.  Pulmonary/Chest: Effort normal.  Neurological: She is alert and oriented to person, place, and time.  Skin: Skin is warm and dry. She is not diaphoretic. No erythema.  Psychiatric: She has a normal mood and affect. Her behavior is normal.   Results for orders placed or performed in visit on 12/14/14  POCT Wet Prep with KOH  Result Value Ref Range   Trichomonas, UA Negative    Clue Cells Wet Prep HPF POC 2-3    Epithelial Wet Prep HPF POC Moderate Few, Moderate, Many   Yeast Wet Prep HPF POC pos    Bacteria Wet Prep HPF POC Moderate (A) None, Few   RBC Wet Prep HPF POC  1-3    WBC Wet Prep HPF POC 10-15    KOH Prep POC Positive   POCT UA - Microscopic Only  Result Value Ref Range   WBC, Ur, HPF, POC 3-6    RBC, urine, microscopic 8-12    Bacteria, U Microscopic 1+    Mucus, UA 3+    Epithelial cells, urine per micros 2-4    Crystals, Ur, HPF, POC neg    Casts, Ur, LPF, POC neg    Yeast, UA neg   POCT urinalysis dipstick  Result Value Ref Range   Color, UA yellow    Clarity, UA clear    Glucose, UA neg    Bilirubin, UA neg    Ketones, UA 15    Spec Grav, UA 1.025    Blood, UA mod    pH, UA 6.0    Protein, UA neg    Urobilinogen, UA 0.2    Nitrite,  UA neg    Leukocytes, UA Negative Negative       Assessment & Plan:   1. Vaginal moniliasis   2. Dysuria   Pt declines pelvic exam today as she is on her menses so requests to self-collect. + yeast, borderline BV so call if sxs persist and could try some vaginal metrogel  Orders Placed This Encounter  Procedures  . POCT Wet Prep with KOH  . POCT UA - Microscopic Only  . POCT urinalysis dipstick    Meds ordered this encounter  Medications  . fluconazole (DIFLUCAN) 150 MG tablet    Sig: Take 1 tablet (150 mg total) by mouth once. Repeat if needed after 3 days    Dispense:  2 tablet    Refill:  0    Norberto Sorenson, MD MPH

## 2014-12-16 ENCOUNTER — Telehealth: Payer: Self-pay

## 2014-12-16 NOTE — Telephone Encounter (Signed)
Pt was seen on 9/19. Her symptoms have not changed and she is wondering what she should do.  Please advise at [phone]

## 2014-12-16 NOTE — Telephone Encounter (Signed)
1. Vaginal moniliasis   2. Dysuria   Pt declines pelvic exam today as she is on her menses so requests to self-collect. + yeast, borderline BV so call if sxs persist and could try some vaginal metrogel

## 2014-12-17 MED ORDER — METRONIDAZOLE 0.75 % VA GEL: 1.0000 | Freq: Every day | VAGINAL | Status: DC

## 2014-12-17 MED ORDER — FLUCONAZOLE 150 MG PO TABS: 150.0000 mg | ORAL_TABLET | Freq: Once | ORAL | Status: DC

## 2014-12-17 NOTE — Telephone Encounter (Signed)
Metrogel sent into pharmacy. Use every night for 5d. Can the repeat the fluconazole after the course.

## 2014-12-17 NOTE — Telephone Encounter (Signed)
Left message letting pt know Rx called in.

## 2014-12-19 ENCOUNTER — Ambulatory Visit (INDEPENDENT_AMBULATORY_CARE_PROVIDER_SITE_OTHER): Payer: BLUE CROSS/BLUE SHIELD | Admitting: Physician Assistant

## 2014-12-19 VITALS — BP 110/70 | HR 77 | Temp 98.4°F | Resp 18 | Ht 67.5 in | Wt 158.2 lb

## 2014-12-19 DIAGNOSIS — R3989 Other symptoms and signs involving the genitourinary system: Secondary | ICD-10-CM

## 2014-12-19 DIAGNOSIS — N898 Other specified noninflammatory disorders of vagina: Secondary | ICD-10-CM

## 2014-12-19 LAB — POCT URINALYSIS DIP (MANUAL ENTRY)
BILIRUBIN UA: NEGATIVE
Glucose, UA: NEGATIVE
Leukocytes, UA: NEGATIVE
Nitrite, UA: NEGATIVE
PH UA: 5.5
Protein Ur, POC: NEGATIVE
SPEC GRAV UA: 1.025
Urobilinogen, UA: 0.2

## 2014-12-19 LAB — POCT URINE PREGNANCY: Preg Test, Ur: NEGATIVE

## 2014-12-19 LAB — POC MICROSCOPIC URINALYSIS (UMFC)

## 2014-12-19 LAB — POCT WET + KOH PREP
Trich by wet prep: ABSENT
YEAST BY KOH: ABSENT
Yeast by wet prep: ABSENT

## 2014-12-19 MED ORDER — PHENAZOPYRIDINE HCL 200 MG PO TABS: 200.0000 mg | ORAL_TABLET | Freq: Three times a day (TID) | ORAL | Status: DC | PRN

## 2014-12-19 MED ORDER — PHENAZOPYRIDINE HCL 200 MG PO TABS: 200.0000 mg | ORAL_TABLET | Freq: Three times a day (TID) | ORAL | Status: AC | PRN

## 2014-12-19 NOTE — Patient Instructions (Addendum)
Your labs in house were not significant to locate what is causing your symptoms.  I am placing a urine culture to address these urinary like symptoms.   I will contact you with the results.   Please finish the metro-gel to completion. Hydrate more with 64oz of water per day.

## 2014-12-19 NOTE — Progress Notes (Addendum)
[facility] [address], [place] [address] [phone]  Date:  12/19/2014   Name:  Faith Hodges   DOB:  January 18, 1976   MRN:  [id number]  PCP:  Cala Bradford, MD    History of Present Illness:  Faith Hodges is a 39 y.o. female patient who presents to [facility] for chief complaint of external vaginal pain.  There is pain that she states is where she urinates, though it is not present with or around urination.  She has no hematuria or frequency.  She has no vaginal odor and very little discharge, after yeast infection treatment.   She noticed that she had lower abdominal cramping yesterday, without  She has lower abdominal pain yesterday.    Married, very rare sexual encounters, due to time--no dyspareunia  .Patient's last menstrual period was 12/09/2014.   No odor, and the vaginal discharge from yeast infection appears to be clearing.   Painful just prior to the actual urination.     Patient Active Problem List   Diagnosis Date Noted  . PMS (premenstrual syndrome) 03/26/2012    Past Medical History  Diagnosis Date  . Elevated cholesterol     Past Surgical History  Procedure Laterality Date  . Tumor removal      lt knee    Social History  Substance Use Topics  . Smoking status: Never Smoker   . Smokeless tobacco: Never Used  . Alcohol Use: No    Family History  Problem Relation Age of Onset  . Heart disease Mother     Allergies  Allergen Reactions  . Shellfish Allergy   . Latex Rash    Medication list has been reviewed and updated.  Current Outpatient Prescriptions on File Prior to Visit  Medication Sig Dispense Refill  . metroNIDAZOLE (METROGEL VAGINAL) 0.75 % vaginal gel Place 1 Applicatorful vaginally at bedtime. x5d 70 g 0  . fluconazole (DIFLUCAN) 150 MG tablet Take 1 tablet (150 mg total) by mouth once. Repeat if needed after 3 days (Patient not taking: Reported on 12/19/2014) 2 tablet 0   No current  facility-administered medications on file prior to visit.    ROS ROS otherwise unremarkable unless listed above.   Physical Examination: BP 110/70 mmHg  Pulse 77  Temp(Src) 98.4 F (36.9 C) (Oral)  Resp 18  Ht 5' 7.5" (1.715 m)  Wt 158 lb 4 oz (71.782 kg)  BMI 24.41 kg/m2  SpO2 99%  LMP 12/09/2014 Ideal Body Weight: Weight in (lb) to have BMI = 25: 161.7  Physical Exam  Constitutional: She is oriented to person, place, and time. She appears well-developed and well-nourished. No distress.  HENT:  Head: Normocephalic and atraumatic.  Right Ear: External ear normal.  Left Ear: External ear normal.  Eyes: Conjunctivae and EOM are normal. Pupils are equal, round, and reactive to light.  Cardiovascular: Normal rate.   Pulmonary/Chest: Effort normal. No respiratory distress.  Genitourinary: Vagina normal. Pelvic exam was performed with patient supine. There is no rash or tenderness on the right labia. There is no rash or tenderness on the left labia. Cervix exhibits no motion tenderness, no discharge and no friability. Right adnexum displays no mass and no tenderness. Left adnexum displays no mass and no tenderness. No erythema in the vagina. No foreign body around the vagina. No signs of injury around the vagina. No vaginal discharge found.  Normal vaginal findings.  Patient points to painful site which appears to be the clitoris and  not the urethra.  No tenderness with palpation hwoever  Neurological: She is alert and oriented to person, place, and time.  Skin: She is not diaphoretic.  Psychiatric: She has a normal mood and affect. Her behavior is normal.    Results for orders placed or performed in visit on 12/19/14  GC/Chlamydia Probe Amp  Result Value Ref Range   CT Probe RNA NEGATIVE    GC Probe RNA NEGATIVE   Urine culture  Result Value Ref Range   Colony Count 7,000 COLONIES/ML    Organism ID, Bacteria Insignificant Growth   POCT urinalysis dipstick  Result Value Ref  Range   Color, UA yellow yellow   Clarity, UA clear clear   Glucose, UA negative negative   Bilirubin, UA negative negative   Ketones, POC UA trace (5) (A) negative   Spec Grav, UA 1.025    Blood, UA trace-lysed (A) negative   pH, UA 5.5    Protein Ur, POC negative negative   Urobilinogen, UA 0.2    Nitrite, UA Negative Negative   Leukocytes, UA Negative Negative  POCT urine pregnancy  Result Value Ref Range   Preg Test, Ur Negative Negative  POCT Microscopic Urinalysis ([facility])  Result Value Ref Range   WBC,UR,HPF,POC Few (A) None WBC/hpf   RBC,UR,HPF,POC Few (A) None RBC/hpf   Bacteria Few (A) None   Mucus Present (A) Absent   Epithelial Cells, UR Per Microscopy Moderate (A) None cells/hpf  POCT Wet + KOH Prep ([facility])  Result Value Ref Range   Yeast by KOH Absent Present, Absent   Yeast by wet prep Absent Present, Absent   WBC by wet prep Few None, Few   Clue Cells Wet Prep HPF POC None None   Trich by wet prep Absent Present, Absent   Bacteria Wet Prep HPF POC Moderate (A) None, Few   Epithelial Cells By [employer] Pref ([facility]) Few None, Few   RBC,UR,HPF,POC None None RBC/hpf     Assessment and Plan: 39 year old female is here today for chief complaint of genital pain. At this time, I am going to prescribe pyridium for pain, to assist in resolve, while labs pending. Difficult to assess cause of this pain, but will treat symptomatically from hx.   Vaginal discharge - Plan: POCT urinalysis dipstick, POCT urine pregnancy, POCT Microscopic Urinalysis ([facility]), POCT Wet + KOH Prep ([facility]), GC/Chlamydia Probe Amp, Urine culture  Urethral pain - Plan: Urine culture, phenazopyridine (PYRIDIUM) 200 MG tablet, DISCONTINUED: phenazopyridine (PYRIDIUM) 200 MG tablet   Trena Platt, PA-C [facility] 12/19/2014 3:43 PM

## 2014-12-21 LAB — URINE CULTURE

## 2014-12-22 ENCOUNTER — Telehealth: Payer: Self-pay

## 2014-12-22 LAB — GC/CHLAMYDIA PROBE AMP
CT PROBE, AMP APTIMA: NEGATIVE
GC Probe RNA: NEGATIVE

## 2014-12-22 NOTE — Telephone Encounter (Signed)
I spoke with Pt. And told her what you wanted [place] to inform her about her lab results. She understood, she just wanted you to be aware that she feels like the pyridium is helping with the pain but it is working slowly. She did say she would follow up with her PCP, but if worsen she will call back and let you know.

## 2014-12-25 ENCOUNTER — Encounter: Payer: Self-pay | Admitting: Family Medicine

## 2015-08-09 DIAGNOSIS — N390 Urinary tract infection, site not specified: Secondary | ICD-10-CM | POA: Diagnosis not present

## 2015-08-09 DIAGNOSIS — R319 Hematuria, unspecified: Secondary | ICD-10-CM | POA: Diagnosis not present

## 2015-10-06 DIAGNOSIS — N920 Excessive and frequent menstruation with regular cycle: Secondary | ICD-10-CM | POA: Diagnosis not present

## 2015-10-06 DIAGNOSIS — N898 Other specified noninflammatory disorders of vagina: Secondary | ICD-10-CM | POA: Diagnosis not present

## 2015-10-06 DIAGNOSIS — Z1231 Encounter for screening mammogram for malignant neoplasm of breast: Secondary | ICD-10-CM | POA: Diagnosis not present

## 2015-10-06 DIAGNOSIS — Z01419 Encounter for gynecological examination (general) (routine) without abnormal findings: Secondary | ICD-10-CM | POA: Diagnosis not present

## 2016-04-06 DIAGNOSIS — R102 Pelvic and perineal pain: Secondary | ICD-10-CM | POA: Diagnosis not present

## 2016-04-06 DIAGNOSIS — N898 Other specified noninflammatory disorders of vagina: Secondary | ICD-10-CM | POA: Diagnosis not present

## 2016-04-06 DIAGNOSIS — Z9189 Other specified personal risk factors, not elsewhere classified: Secondary | ICD-10-CM | POA: Diagnosis not present

## 2016-07-04 DIAGNOSIS — R52 Pain, unspecified: Secondary | ICD-10-CM | POA: Diagnosis not present

## 2016-07-04 DIAGNOSIS — B001 Herpesviral vesicular dermatitis: Secondary | ICD-10-CM | POA: Diagnosis not present

## 2016-10-26 DIAGNOSIS — Z01419 Encounter for gynecological examination (general) (routine) without abnormal findings: Secondary | ICD-10-CM | POA: Diagnosis not present

## 2016-10-26 DIAGNOSIS — Z1231 Encounter for screening mammogram for malignant neoplasm of breast: Secondary | ICD-10-CM | POA: Diagnosis not present

## 2016-10-26 DIAGNOSIS — B009 Herpesviral infection, unspecified: Secondary | ICD-10-CM | POA: Diagnosis not present

## 2017-02-19 DIAGNOSIS — N6310 Unspecified lump in the right breast, unspecified quadrant: Secondary | ICD-10-CM | POA: Diagnosis not present

## 2017-02-19 DIAGNOSIS — N644 Mastodynia: Secondary | ICD-10-CM | POA: Diagnosis not present

## 2017-02-19 DIAGNOSIS — R922 Inconclusive mammogram: Secondary | ICD-10-CM | POA: Diagnosis not present

## 2017-09-11 DIAGNOSIS — N7689 Other specified inflammation of vagina and vulva: Secondary | ICD-10-CM | POA: Diagnosis not present

## 2017-09-11 DIAGNOSIS — N898 Other specified noninflammatory disorders of vagina: Secondary | ICD-10-CM | POA: Diagnosis not present

## 2017-10-15 DIAGNOSIS — F4320 Adjustment disorder, unspecified: Secondary | ICD-10-CM | POA: Diagnosis not present

## 2017-10-15 DIAGNOSIS — Z0000 Encounter for general adult medical examination without abnormal findings: Secondary | ICD-10-CM | POA: Diagnosis not present

## 2017-10-15 DIAGNOSIS — M79602 Pain in left arm: Secondary | ICD-10-CM | POA: Diagnosis not present

## 2017-10-15 DIAGNOSIS — F39 Unspecified mood [affective] disorder: Secondary | ICD-10-CM | POA: Diagnosis not present

## 2017-10-16 DIAGNOSIS — Q72812 Congenital shortening of left lower limb: Secondary | ICD-10-CM | POA: Diagnosis not present

## 2017-10-16 DIAGNOSIS — M9905 Segmental and somatic dysfunction of pelvic region: Secondary | ICD-10-CM | POA: Diagnosis not present

## 2017-10-16 DIAGNOSIS — M9903 Segmental and somatic dysfunction of lumbar region: Secondary | ICD-10-CM | POA: Diagnosis not present

## 2017-10-16 DIAGNOSIS — M5136 Other intervertebral disc degeneration, lumbar region: Secondary | ICD-10-CM | POA: Diagnosis not present

## 2017-10-26 DIAGNOSIS — F4320 Adjustment disorder, unspecified: Secondary | ICD-10-CM | POA: Diagnosis not present

## 2017-10-26 DIAGNOSIS — F39 Unspecified mood [affective] disorder: Secondary | ICD-10-CM | POA: Diagnosis not present

## 2017-10-26 DIAGNOSIS — H6122 Impacted cerumen, left ear: Secondary | ICD-10-CM | POA: Diagnosis not present

## 2017-10-26 DIAGNOSIS — Z0001 Encounter for general adult medical examination with abnormal findings: Secondary | ICD-10-CM | POA: Diagnosis not present

## 2017-10-31 ENCOUNTER — Other Ambulatory Visit: Payer: Self-pay | Admitting: Obstetrics and Gynecology

## 2017-10-31 DIAGNOSIS — N6310 Unspecified lump in the right breast, unspecified quadrant: Secondary | ICD-10-CM

## 2017-10-31 DIAGNOSIS — Z6825 Body mass index (BMI) 25.0-25.9, adult: Secondary | ICD-10-CM | POA: Diagnosis not present

## 2017-10-31 DIAGNOSIS — Z01419 Encounter for gynecological examination (general) (routine) without abnormal findings: Secondary | ICD-10-CM | POA: Diagnosis not present

## 2017-11-05 ENCOUNTER — Other Ambulatory Visit: Payer: BLUE CROSS/BLUE SHIELD

## 2017-11-06 ENCOUNTER — Ambulatory Visit
Admission: RE | Admit: 2017-11-06 | Discharge: 2017-11-06 | Disposition: A | Discharge status: Home / Self Care | Payer: BLUE CROSS/BLUE SHIELD | Source: Ambulatory Visit | Attending: Obstetrics and Gynecology | Admitting: Obstetrics and Gynecology

## 2017-11-06 DIAGNOSIS — N6310 Unspecified lump in the right breast, unspecified quadrant: Secondary | ICD-10-CM | POA: Diagnosis not present

## 2017-11-06 DIAGNOSIS — R922 Inconclusive mammogram: Secondary | ICD-10-CM | POA: Diagnosis not present

## 2017-12-11 ENCOUNTER — Other Ambulatory Visit: Payer: Self-pay | Admitting: Obstetrics and Gynecology

## 2017-12-11 DIAGNOSIS — N6310 Unspecified lump in the right breast, unspecified quadrant: Secondary | ICD-10-CM

## 2017-12-13 ENCOUNTER — Ambulatory Visit
Admission: RE | Admit: 2017-12-13 | Discharge: 2017-12-13 | Disposition: A | Discharge status: Home / Self Care | Payer: BLUE CROSS/BLUE SHIELD | Source: Ambulatory Visit | Attending: Obstetrics and Gynecology | Admitting: Obstetrics and Gynecology

## 2017-12-13 ENCOUNTER — Other Ambulatory Visit: Payer: Self-pay | Admitting: Obstetrics and Gynecology

## 2017-12-13 ENCOUNTER — Ambulatory Visit: Payer: BLUE CROSS/BLUE SHIELD

## 2017-12-13 DIAGNOSIS — N6001 Solitary cyst of right breast: Secondary | ICD-10-CM | POA: Diagnosis not present

## 2017-12-13 DIAGNOSIS — N6310 Unspecified lump in the right breast, unspecified quadrant: Secondary | ICD-10-CM

## 2018-09-30 DIAGNOSIS — L659 Nonscarring hair loss, unspecified: Secondary | ICD-10-CM | POA: Diagnosis not present

## 2018-09-30 DIAGNOSIS — L089 Local infection of the skin and subcutaneous tissue, unspecified: Secondary | ICD-10-CM | POA: Diagnosis not present

## 2018-09-30 DIAGNOSIS — L650 Telogen effluvium: Secondary | ICD-10-CM | POA: Diagnosis not present

## 2018-09-30 DIAGNOSIS — L910 Hypertrophic scar: Secondary | ICD-10-CM | POA: Diagnosis not present

## 2018-09-30 DIAGNOSIS — L700 Acne vulgaris: Secondary | ICD-10-CM | POA: Diagnosis not present

## 2018-09-30 DIAGNOSIS — L810 Postinflammatory hyperpigmentation: Secondary | ICD-10-CM | POA: Diagnosis not present

## 2018-09-30 DIAGNOSIS — L720 Epidermal cyst: Secondary | ICD-10-CM | POA: Diagnosis not present

## 2018-11-01 DIAGNOSIS — Z20828 Contact with and (suspected) exposure to other viral communicable diseases: Secondary | ICD-10-CM | POA: Diagnosis not present

## 2018-11-25 DIAGNOSIS — L720 Epidermal cyst: Secondary | ICD-10-CM | POA: Diagnosis not present

## 2018-12-11 DIAGNOSIS — N390 Urinary tract infection, site not specified: Secondary | ICD-10-CM | POA: Diagnosis not present

## 2018-12-13 DIAGNOSIS — M542 Cervicalgia: Secondary | ICD-10-CM | POA: Diagnosis not present

## 2018-12-13 DIAGNOSIS — R200 Anesthesia of skin: Secondary | ICD-10-CM | POA: Diagnosis not present

## 2019-02-03 DIAGNOSIS — Z124 Encounter for screening for malignant neoplasm of cervix: Secondary | ICD-10-CM | POA: Diagnosis not present

## 2019-02-03 DIAGNOSIS — Z6827 Body mass index (BMI) 27.0-27.9, adult: Secondary | ICD-10-CM | POA: Diagnosis not present

## 2019-02-03 DIAGNOSIS — N838 Other noninflammatory disorders of ovary, fallopian tube and broad ligament: Secondary | ICD-10-CM | POA: Diagnosis not present

## 2019-02-03 DIAGNOSIS — B001 Herpesviral vesicular dermatitis: Secondary | ICD-10-CM | POA: Diagnosis not present

## 2019-02-03 DIAGNOSIS — Z1231 Encounter for screening mammogram for malignant neoplasm of breast: Secondary | ICD-10-CM | POA: Diagnosis not present

## 2019-02-03 DIAGNOSIS — Z01419 Encounter for gynecological examination (general) (routine) without abnormal findings: Secondary | ICD-10-CM | POA: Diagnosis not present

## 2019-03-01 DIAGNOSIS — Z20828 Contact with and (suspected) exposure to other viral communicable diseases: Secondary | ICD-10-CM | POA: Diagnosis not present

## 2019-03-10 DIAGNOSIS — R87619 Unspecified abnormal cytological findings in specimens from cervix uteri: Secondary | ICD-10-CM | POA: Diagnosis not present

## 2019-03-19 DIAGNOSIS — R87619 Unspecified abnormal cytological findings in specimens from cervix uteri: Secondary | ICD-10-CM | POA: Diagnosis not present

## 2019-03-20 IMAGING — US ULTRASOUND RIGHT BREAST LIMITED
1 series · 8 of 8 positions shown · non-contrast
Comparison: Previous exam(s).

CLINICAL DATA: 42-year-old female with palpable RIGHT breast mass
identified on self and clinical examination. Also for annual
bilateral mammograms.

EXAM:
DIGITAL DIAGNOSTIC BILATERAL MAMMOGRAM WITH CAD AND TOMO
ULTRASOUND RIGHT BREAST

[Series 1: ultrasound right breast limited · 0.07mm/px · 8 of 8 slices shown]
[im 1/8]
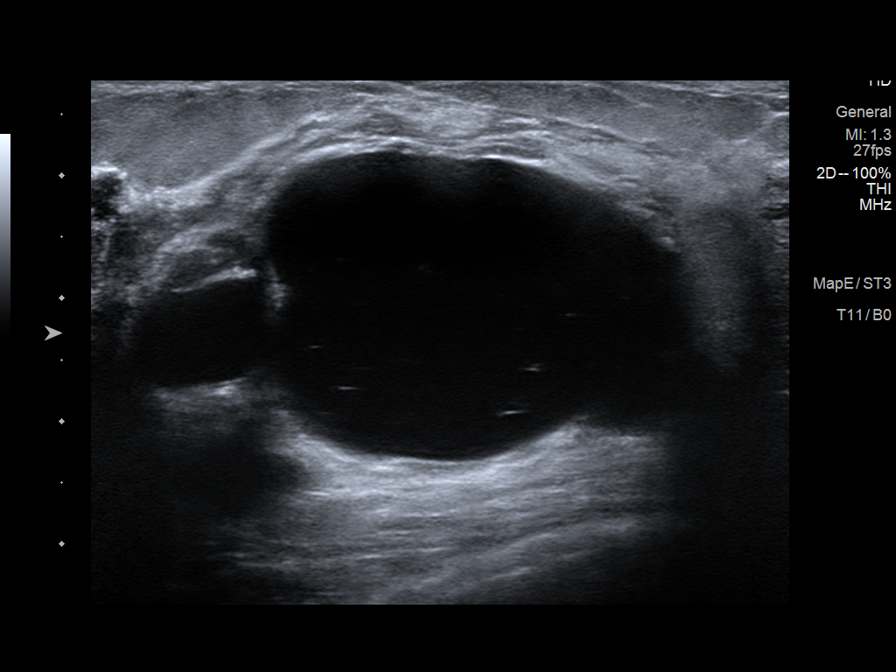
[im 2/8]
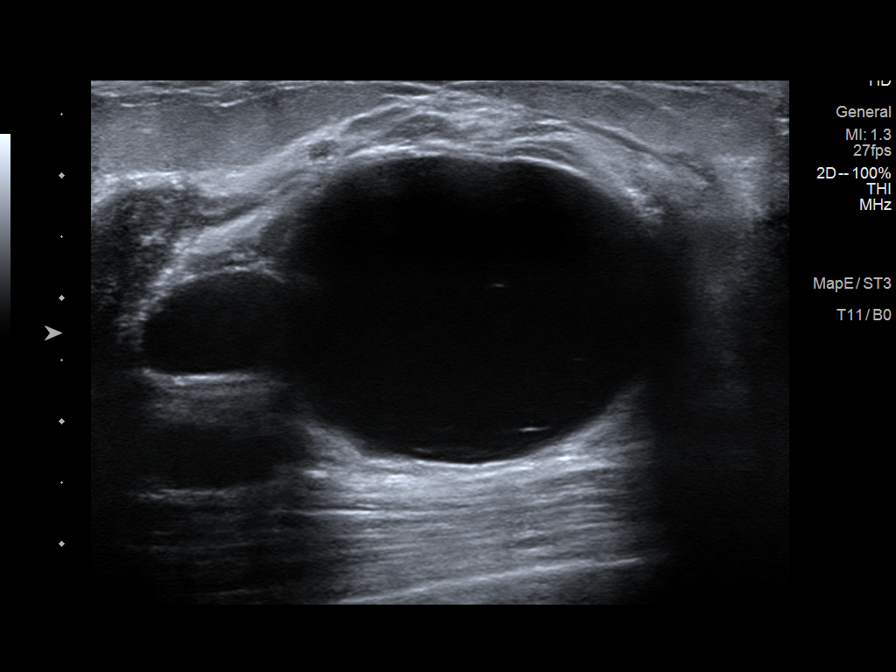
[im 3/8]
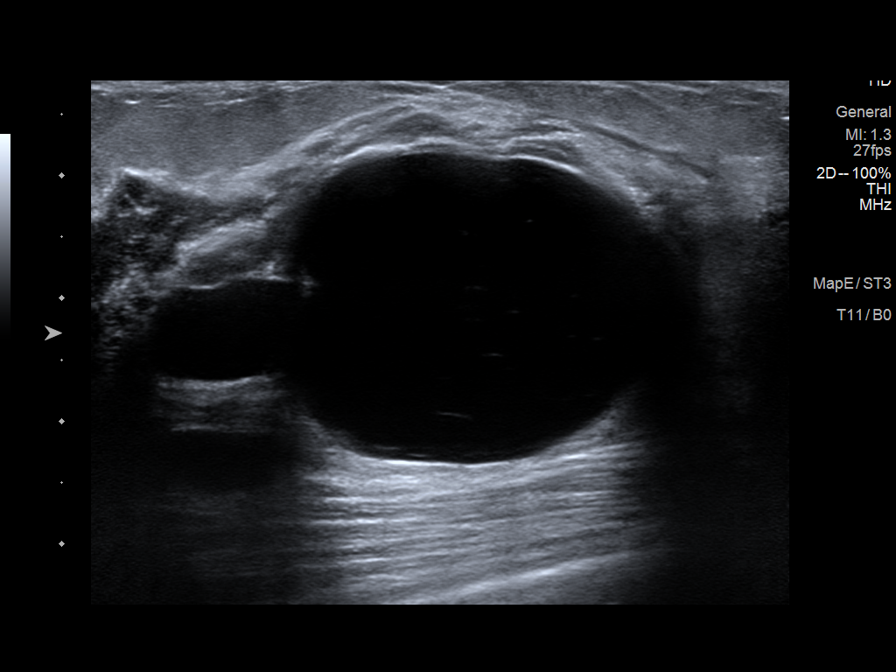
[im 4/8]
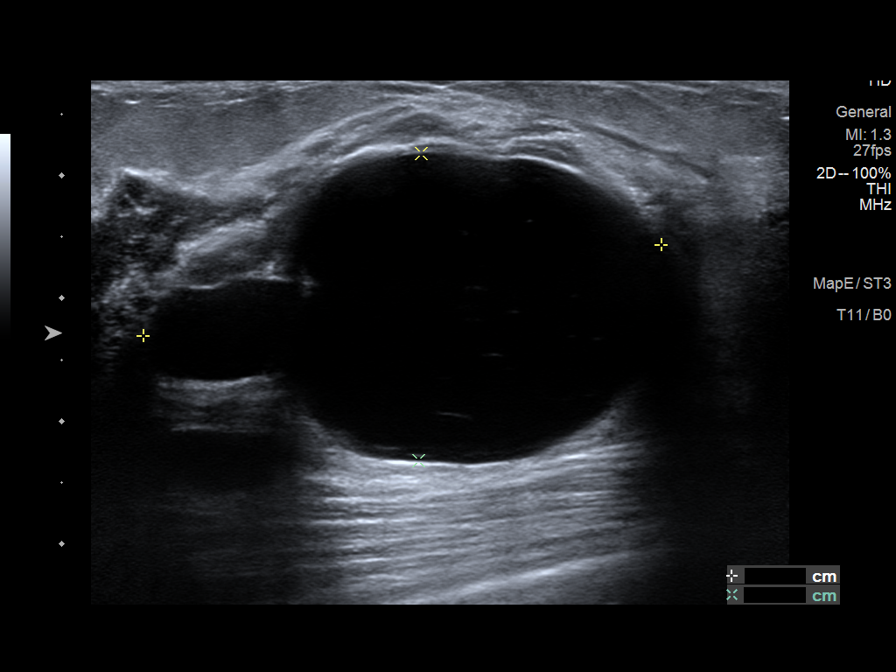
[im 5/8]
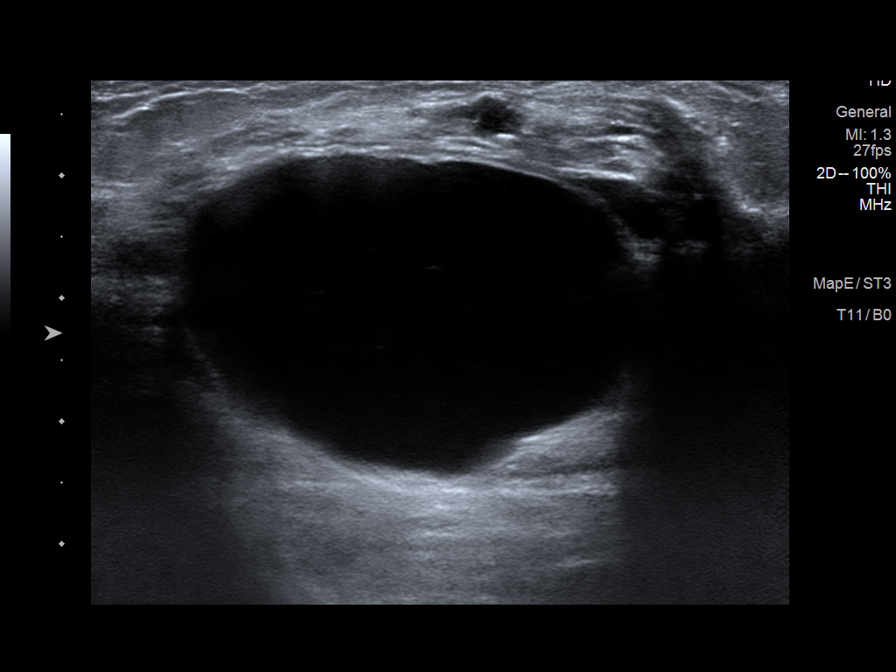
[im 6/8]
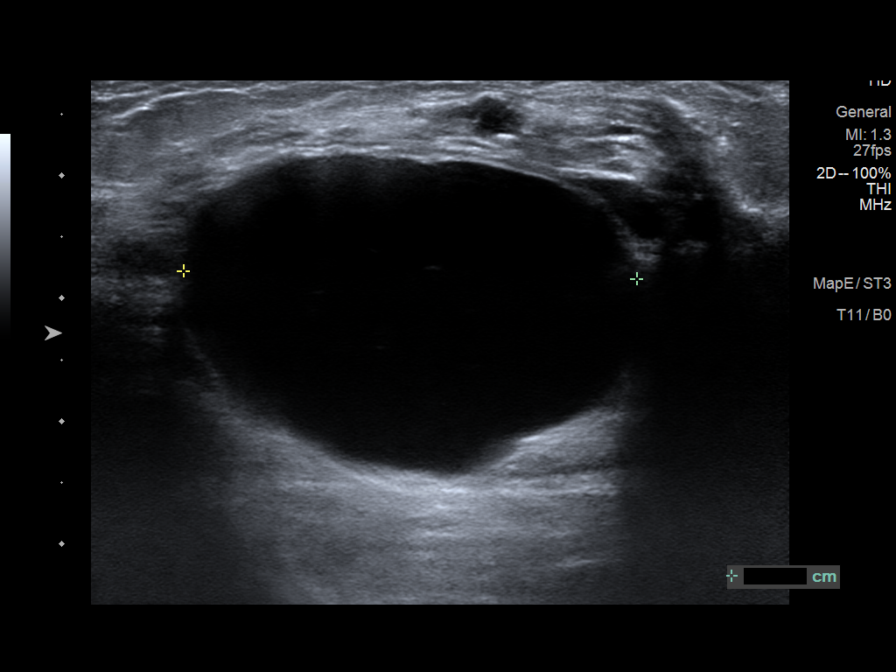
[im 7/8]
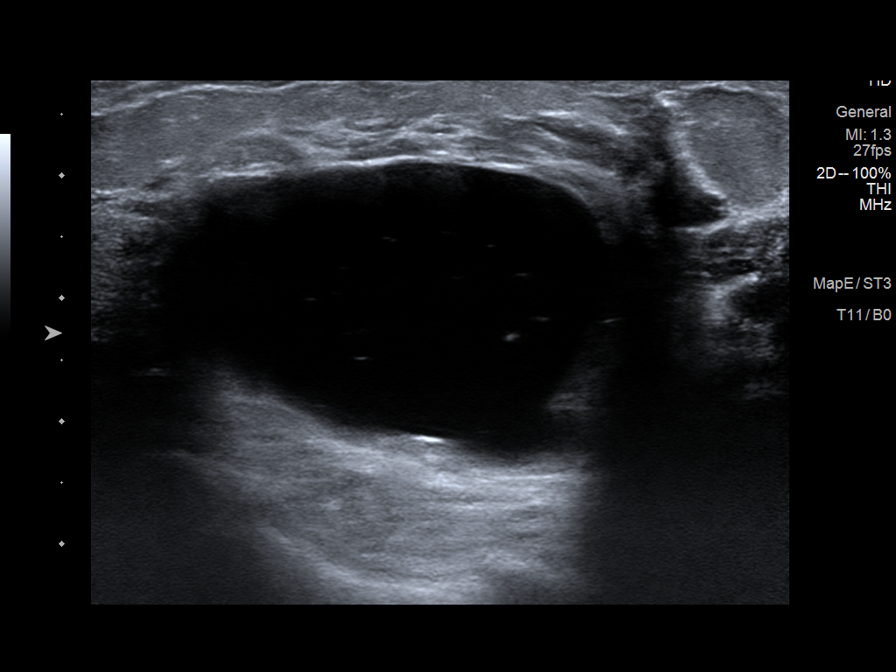
[im 8/8]
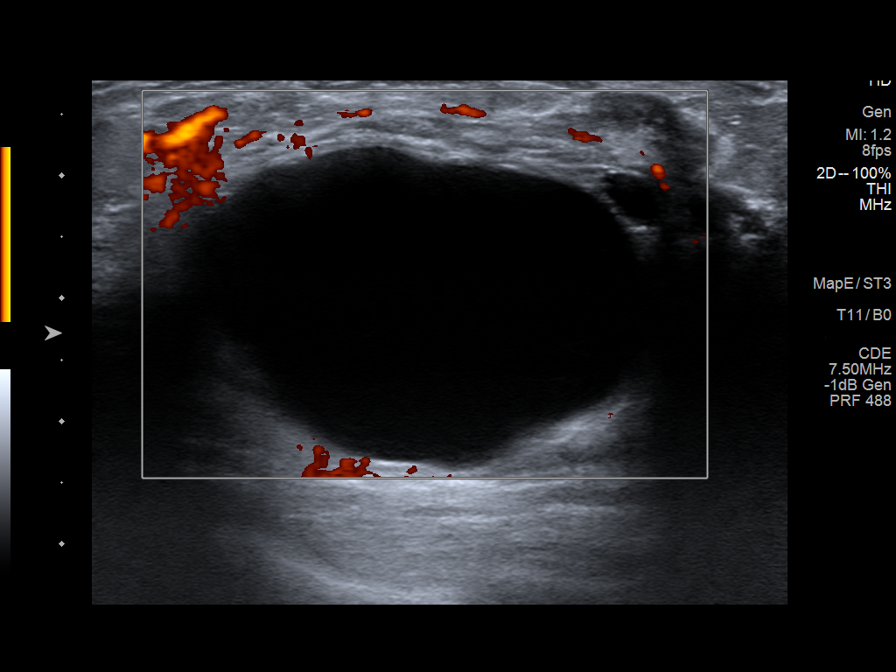

[8 of 8 positions shown; findings below may reference images not displayed]

ACR Breast Density Category d: The breast tissue is extremely dense,
which lowers the sensitivity of mammography.
FINDINGS: 2D/3D full field views of both breasts demonstrate a circumscribed
oval mass within the UPPER INNER RIGHT breast.

No other mass, distortion or worrisome calcifications are noted
bilaterally.

Mammographic images were processed with CAD.

On physical exam, a firm palpable mobile mass is identified at the 1
o'clock position of the RIGHT breast 3 cm from the nipple.

Targeted ultrasound is performed, showing a 4.3 x 2.5 x 3.7 cm
minimally complicated benign cyst with internal mobile echoes at the
1 o'clock position of the RIGHT breast 3 cm from the nipple,
corresponding to the palpable finding.
IMPRESSION: 1. Benign cyst within the UPPER INNER RIGHT breast corresponding to
the palpable finding.
2. No mammographic evidence of breast malignancy otherwise.

RECOMMENDATION:
Bilateral screening mammograms in 1 year.

I have discussed the findings and recommendations with the patient.
Results were also provided in writing at the conclusion of the
visit. If applicable, a reminder letter will be sent to the patient
regarding the next appointment.

BI-RADS CATEGORY  2: Benign.

## 2019-03-20 IMAGING — MG DIGITAL DIAGNOSTIC BILATERAL MAMMOGRAM WITH TOMO AND CAD
6 of 10 series · 6 of 30 positions shown · non-contrast
Comparison: Previous exam(s).

CLINICAL DATA: 42-year-old female with palpable RIGHT breast mass
identified on self and clinical examination. Also for annual
bilateral mammograms.

EXAM:
DIGITAL DIAGNOSTIC BILATERAL MAMMOGRAM WITH CAD AND TOMO
ULTRASOUND RIGHT BREAST

[R TAN synth-2D]
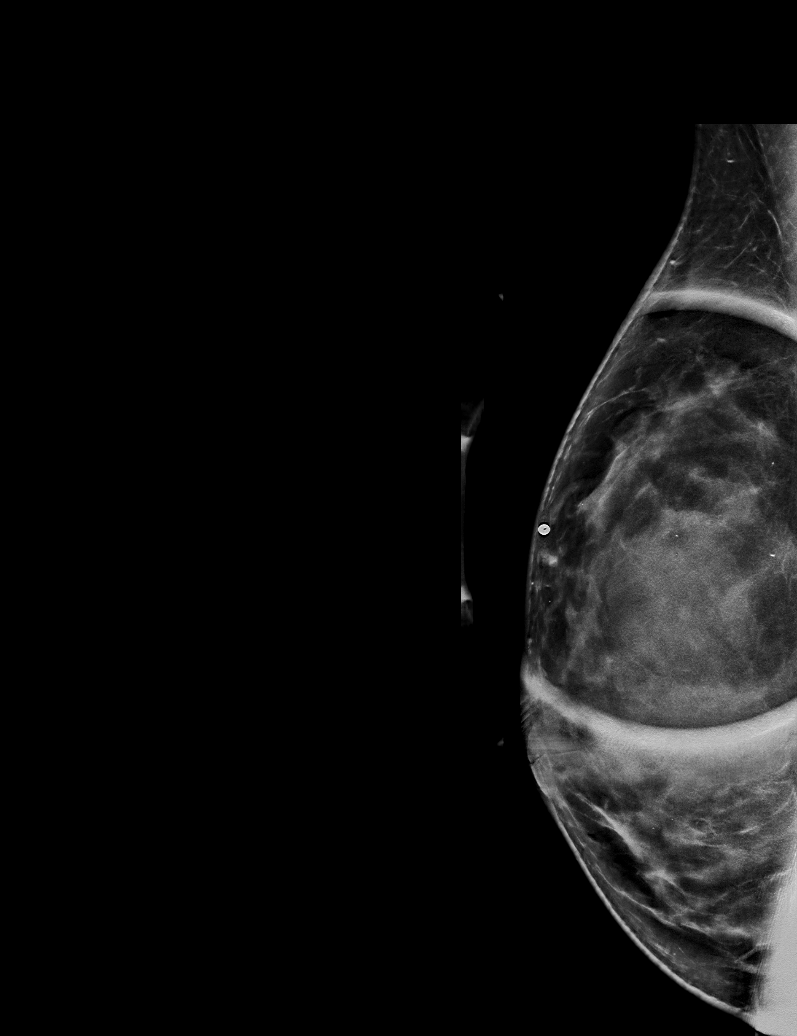

[R MLO synth-2D]
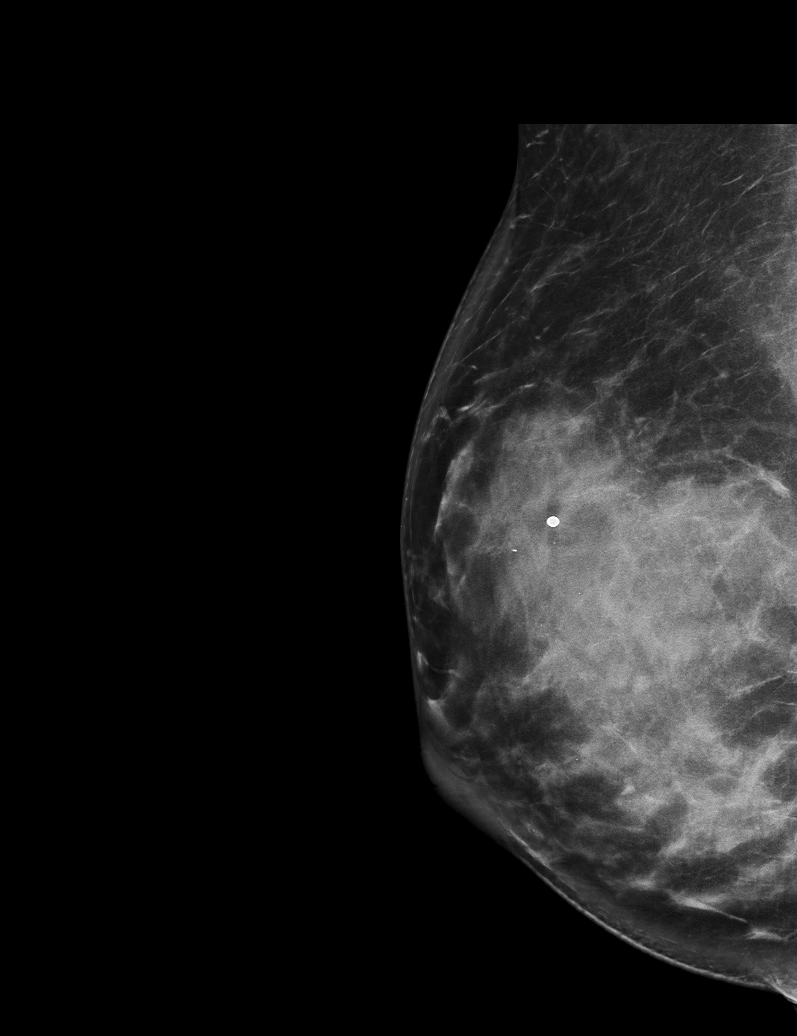

[R CC synth-2D]
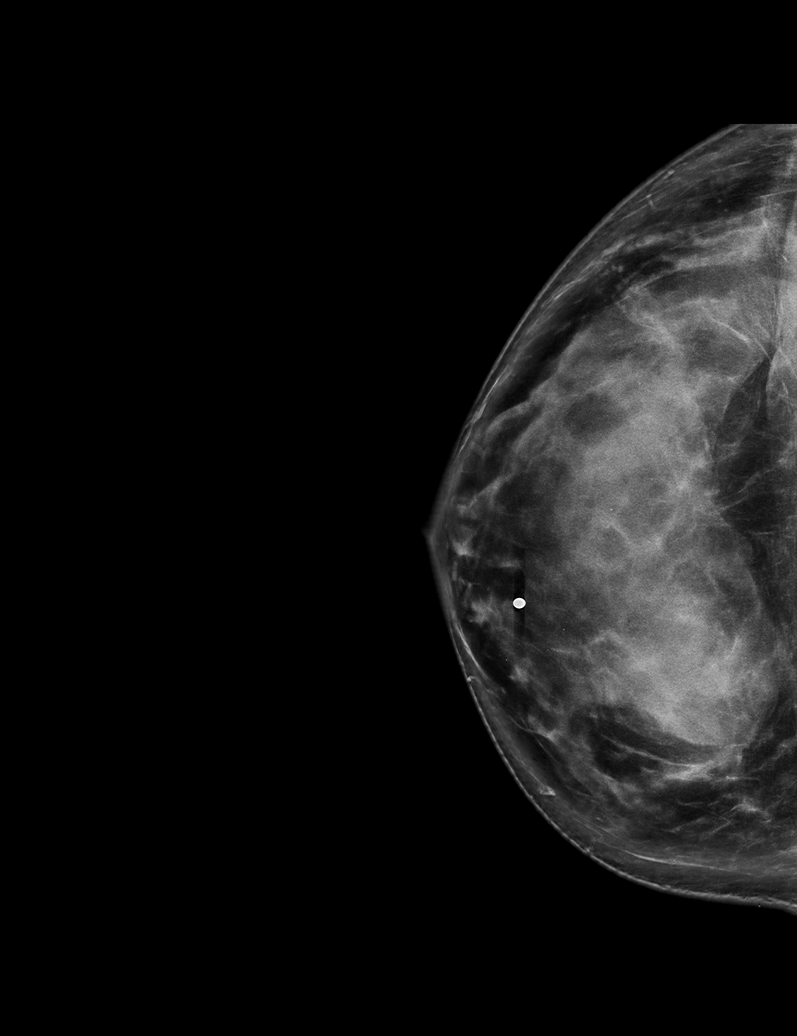

[L MLO synth-2D]
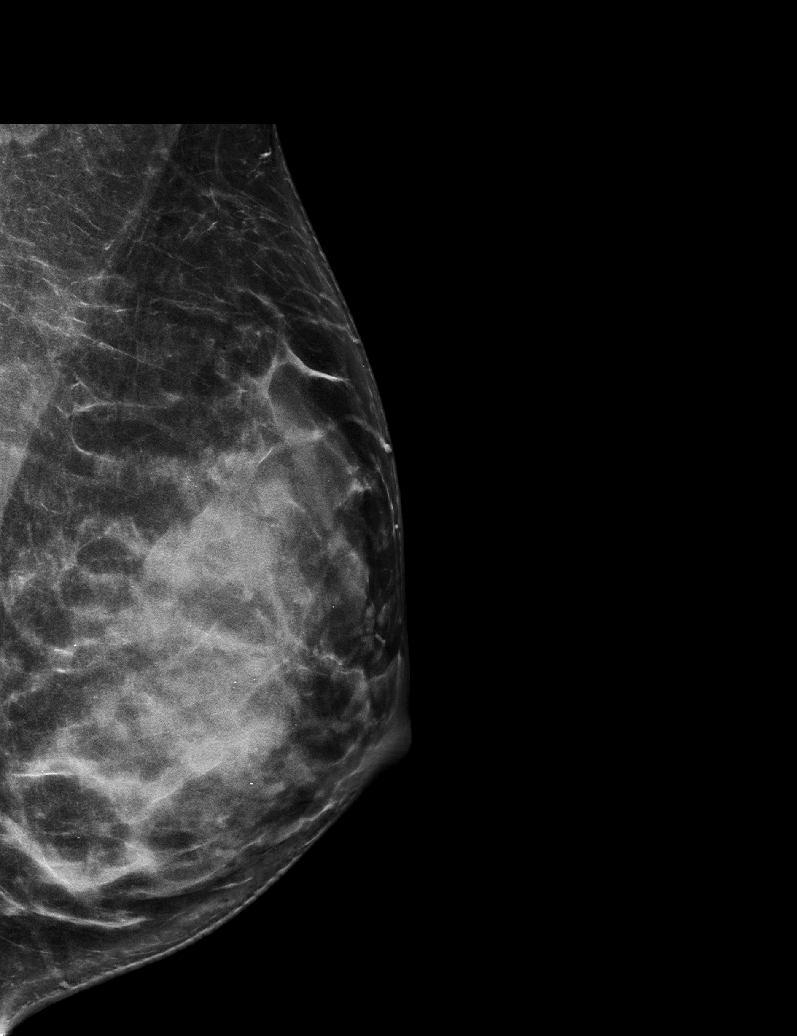

[L CC synth-2D]
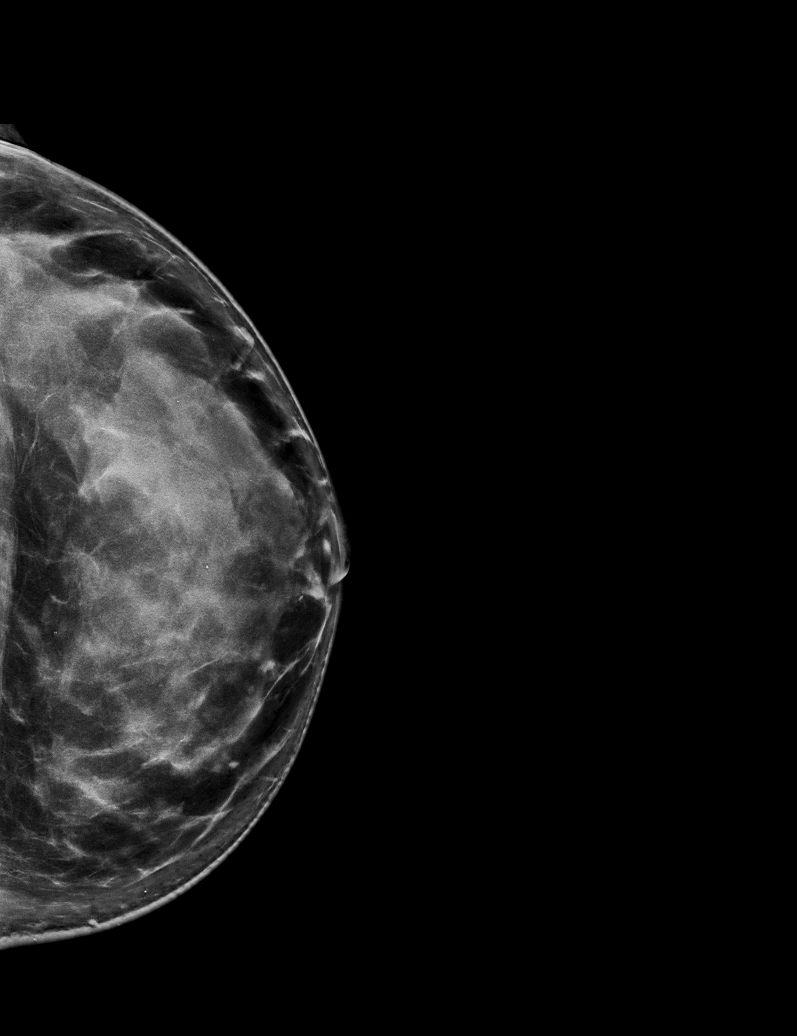

[L CC tomo · tomo slice 43/84.0]
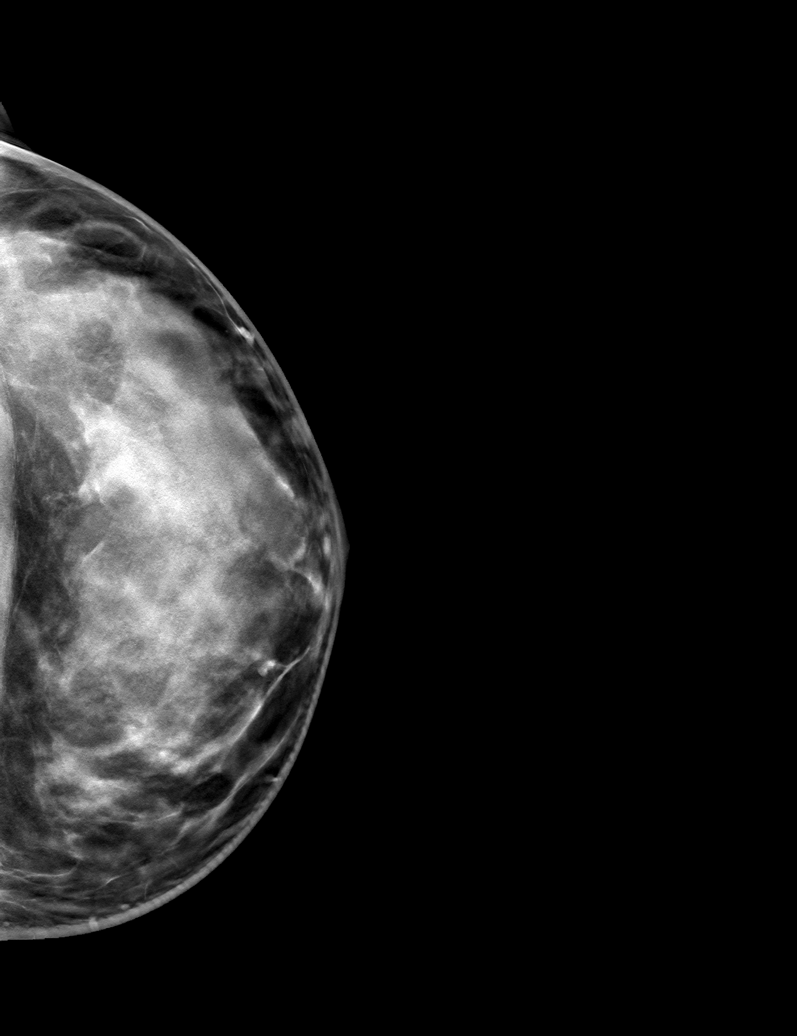

[6 of 30 positions shown; findings below may reference images not displayed]

ACR Breast Density Category d: The breast tissue is extremely dense,
which lowers the sensitivity of mammography.
FINDINGS: 2D/3D full field views of both breasts demonstrate a circumscribed
oval mass within the UPPER INNER RIGHT breast.

No other mass, distortion or worrisome calcifications are noted
bilaterally.

Mammographic images were processed with CAD.

On physical exam, a firm palpable mobile mass is identified at the 1
o'clock position of the RIGHT breast 3 cm from the nipple.

Targeted ultrasound is performed, showing a 4.3 x 2.5 x 3.7 cm
minimally complicated benign cyst with internal mobile echoes at the
1 o'clock position of the RIGHT breast 3 cm from the nipple,
corresponding to the palpable finding.
IMPRESSION: 1. Benign cyst within the UPPER INNER RIGHT breast corresponding to
the palpable finding.
2. No mammographic evidence of breast malignancy otherwise.

RECOMMENDATION:
Bilateral screening mammograms in 1 year.

I have discussed the findings and recommendations with the patient.
Results were also provided in writing at the conclusion of the
visit. If applicable, a reminder letter will be sent to the patient
regarding the next appointment.

BI-RADS CATEGORY  2: Benign.

## 2019-04-07 DIAGNOSIS — M545 Low back pain: Secondary | ICD-10-CM | POA: Diagnosis not present

## 2019-05-02 DIAGNOSIS — N859 Noninflammatory disorder of uterus, unspecified: Secondary | ICD-10-CM | POA: Diagnosis not present

## 2019-05-02 DIAGNOSIS — R87619 Unspecified abnormal cytological findings in specimens from cervix uteri: Secondary | ICD-10-CM | POA: Diagnosis not present

## 2019-05-27 DIAGNOSIS — R194 Change in bowel habit: Secondary | ICD-10-CM | POA: Diagnosis not present

## 2019-05-27 DIAGNOSIS — R87619 Unspecified abnormal cytological findings in specimens from cervix uteri: Secondary | ICD-10-CM | POA: Diagnosis not present

## 2019-05-27 DIAGNOSIS — O09519 Supervision of elderly primigravida, unspecified trimester: Secondary | ICD-10-CM | POA: Diagnosis not present

## 2019-06-11 DIAGNOSIS — E2839 Other primary ovarian failure: Secondary | ICD-10-CM | POA: Diagnosis not present

## 2019-06-11 DIAGNOSIS — D250 Submucous leiomyoma of uterus: Secondary | ICD-10-CM | POA: Diagnosis not present

## 2019-06-11 DIAGNOSIS — D252 Subserosal leiomyoma of uterus: Secondary | ICD-10-CM | POA: Diagnosis not present

## 2019-06-11 DIAGNOSIS — Z319 Encounter for procreative management, unspecified: Secondary | ICD-10-CM | POA: Diagnosis not present

## 2019-07-01 DIAGNOSIS — M545 Low back pain: Secondary | ICD-10-CM | POA: Diagnosis not present

## 2019-09-01 DIAGNOSIS — M542 Cervicalgia: Secondary | ICD-10-CM | POA: Diagnosis not present

## 2020-01-19 DIAGNOSIS — Z20822 Contact with and (suspected) exposure to covid-19: Secondary | ICD-10-CM | POA: Diagnosis not present

## 2020-01-19 DIAGNOSIS — R509 Fever, unspecified: Secondary | ICD-10-CM | POA: Diagnosis not present

## 2020-01-19 DIAGNOSIS — U071 COVID-19: Secondary | ICD-10-CM | POA: Diagnosis not present

## 2020-01-19 DIAGNOSIS — M791 Myalgia, unspecified site: Secondary | ICD-10-CM | POA: Diagnosis not present

## 2020-01-21 DIAGNOSIS — U071 COVID-19: Secondary | ICD-10-CM | POA: Diagnosis not present

## 2020-01-22 ENCOUNTER — Ambulatory Visit (HOSPITAL_COMMUNITY)
Admission: RE | Admit: 2020-01-22 | Discharge: 2020-01-22 | Disposition: A | Discharge status: Home / Self Care | Payer: BC Managed Care – PPO | Source: Ambulatory Visit | Attending: Pulmonary Disease | Admitting: Pulmonary Disease

## 2020-01-22 ENCOUNTER — Other Ambulatory Visit: Payer: Self-pay | Admitting: Adult Health

## 2020-01-22 DIAGNOSIS — U071 COVID-19: Secondary | ICD-10-CM | POA: Insufficient documentation

## 2020-01-22 MED ORDER — SODIUM CHLORIDE 0.9 % IV SOLN: Freq: Once | INTRAVENOUS | Status: AC

## 2020-01-22 MED ORDER — ALBUTEROL SULFATE HFA 108 (90 BASE) MCG/ACT IN AERS: 2.0000 | INHALATION_SPRAY | Freq: Once | RESPIRATORY_TRACT | Status: DC | PRN

## 2020-01-22 MED ORDER — EPINEPHRINE 0.3 MG/0.3ML IJ SOAJ: 0.3000 mg | Freq: Once | INTRAMUSCULAR | Status: DC | PRN

## 2020-01-22 MED ORDER — DIPHENHYDRAMINE HCL 50 MG/ML IJ SOLN: 50.0000 mg | Freq: Once | INTRAMUSCULAR | Status: DC | PRN

## 2020-01-22 MED ORDER — METHYLPREDNISOLONE SODIUM SUCC 125 MG IJ SOLR: 125.0000 mg | Freq: Once | INTRAMUSCULAR | Status: DC | PRN

## 2020-01-22 MED ORDER — FAMOTIDINE IN NACL 20-0.9 MG/50ML-% IV SOLN: 20.0000 mg | Freq: Once | INTRAVENOUS | Status: DC | PRN

## 2020-01-22 MED ORDER — SODIUM CHLORIDE 0.9 % IV SOLN: INTRAVENOUS | Status: DC | PRN

## 2020-01-22 NOTE — Discharge Instructions (Signed)

## 2020-01-22 NOTE — Progress Notes (Signed)
I connected by phone with Langston Masker on 01/22/2020 at 10:36 AM to discuss the potential use of a new treatment for mild to moderate COVID-19 viral infection in non-hospitalized patients.  This patient is a 44 y.o. female that meets the FDA criteria for Emergency Use Authorization of COVID monoclonal antibody casirivimab/imdevimab or bamlanivimab/eteseviamb.  Has a (+) direct SARS-CoV-2 viral test result  Has mild or moderate COVID-19   Is NOT hospitalized due to COVID-19  Is within 10 days of symptom onset  Has at least one of the high risk factor(s) for progression to severe COVID-19 and/or hospitalization as defined in EUA.  Specific high risk criteria : Other high risk medical condition per CDC:  social vulnerability Sx onset 01/15/2020  I have spoken and communicated the following to the patient or parent/caregiver regarding COVID monoclonal antibody treatment:  1. FDA has authorized the emergency use for the treatment of mild to moderate COVID-19 in adults and pediatric patients with positive results of direct SARS-CoV-2 viral testing who are 58 years of age and older weighing at least 40 kg, and who are at high risk for progressing to severe COVID-19 and/or hospitalization.  2. The significant known and potential risks and benefits of COVID monoclonal antibody, and the extent to which such potential risks and benefits are unknown.  3. Information on available alternative treatments and the risks and benefits of those alternatives, including clinical trials.  4. Patients treated with COVID monoclonal antibody should continue to self-isolate and use infection control measures (e.g., wear mask, isolate, social distance, avoid sharing personal items, clean and disinfect high touch surfaces, and frequent handwashing) according to CDC guidelines.   5. The patient or parent/caregiver has the option to accept or refuse COVID monoclonal antibody treatment.  After reviewing this  information with the patient, the patient has agreed to receive one of the available covid 19 monoclonal antibodies and will be provided an appropriate fact sheet prior to infusion. Noreene Filbert, NP 01/22/2020 10:36 AM

## 2020-01-22 NOTE — Progress Notes (Signed)
  Diagnosis: COVID-19  Physician:Dr Delford Field  Procedure: [facility] Med: bamlanivimab\etesevimab infusion - Provided patient with bamlanimivab\etesevimab fact sheet for patients, parents and caregivers prior to infusion.  Complications: No immediate complications noted.  Discharge: Discharged home   Faith Hodges 01/22/2020

## 2020-02-04 DIAGNOSIS — N760 Acute vaginitis: Secondary | ICD-10-CM | POA: Diagnosis not present

## 2020-02-07 DIAGNOSIS — R112 Nausea with vomiting, unspecified: Secondary | ICD-10-CM | POA: Diagnosis not present

## 2020-02-07 DIAGNOSIS — N342 Other urethritis: Secondary | ICD-10-CM | POA: Diagnosis not present

## 2020-02-08 ENCOUNTER — Other Ambulatory Visit: Payer: Self-pay

## 2020-02-08 ENCOUNTER — Encounter (HOSPITAL_COMMUNITY): Payer: Self-pay | Admitting: Obstetrics and Gynecology

## 2020-02-08 ENCOUNTER — Emergency Department (HOSPITAL_COMMUNITY)
Admission: EM | Admit: 2020-02-08 | Discharge: 2020-02-08 | Disposition: A | Discharge status: Home / Self Care | Payer: BC Managed Care – PPO | Attending: Emergency Medicine | Admitting: Emergency Medicine

## 2020-02-08 ENCOUNTER — Emergency Department (HOSPITAL_COMMUNITY): Payer: BC Managed Care – PPO

## 2020-02-08 DIAGNOSIS — R1032 Left lower quadrant pain: Secondary | ICD-10-CM | POA: Diagnosis not present

## 2020-02-08 DIAGNOSIS — N132 Hydronephrosis with renal and ureteral calculous obstruction: Secondary | ICD-10-CM | POA: Diagnosis not present

## 2020-02-08 DIAGNOSIS — N200 Calculus of kidney: Secondary | ICD-10-CM | POA: Diagnosis not present

## 2020-02-08 DIAGNOSIS — Z91040 Latex allergy status: Secondary | ICD-10-CM | POA: Diagnosis not present

## 2020-02-08 LAB — URINALYSIS, ROUTINE W REFLEX MICROSCOPIC
Bilirubin Urine: NEGATIVE
Glucose, UA: NEGATIVE mg/dL
Ketones, ur: 20 mg/dL — AB
Leukocytes,Ua: NEGATIVE
Nitrite: NEGATIVE
Protein, ur: NEGATIVE mg/dL
Specific Gravity, Urine: 1.008 (ref 1.005–1.030)
pH: 7 (ref 5.0–8.0)

## 2020-02-08 LAB — COMPREHENSIVE METABOLIC PANEL
ALT: 15 U/L (ref 0–44)
AST: 22 U/L (ref 15–41)
Albumin: 4.8 g/dL (ref 3.5–5.0)
Alkaline Phosphatase: 45 U/L (ref 38–126)
Anion gap: 10 (ref 5–15)
BUN: 12 mg/dL (ref 6–20)
CO2: 22 mmol/L (ref 22–32)
Calcium: 9 mg/dL (ref 8.9–10.3)
Chloride: 103 mmol/L (ref 98–111)
Creatinine, Ser: 0.94 mg/dL (ref 0.44–1.00)
GFR, Estimated: >60 mL/min (ref >60)
Glucose, Bld: 102 mg/dL — ABNORMAL HIGH (ref 70–99)
Potassium: 3.6 mmol/L (ref 3.5–5.1)
Sodium: 135 mmol/L (ref 135–145)
Total Bilirubin: 1.3 mg/dL — ABNORMAL HIGH (ref 0.3–1.2)
Total Protein: 8.8 g/dL — ABNORMAL HIGH (ref 6.5–8.1)

## 2020-02-08 LAB — LIPASE, BLOOD: Lipase: 33 U/L (ref 11–51)

## 2020-02-08 LAB — CBC
HCT: 42.5 % (ref 36.0–46.0)
Hemoglobin: 13.9 g/dL (ref 12.0–15.0)
MCH: 29.8 pg (ref 26.0–34.0)
MCHC: 32.7 g/dL (ref 30.0–36.0)
MCV: 91 fL (ref 80.0–100.0)
Platelets: 299 10*3/uL (ref 150–400)
RBC: 4.67 MIL/uL (ref 3.87–5.11)
RDW: 13.7 % (ref 11.5–15.5)
WBC: 11.7 10*3/uL — ABNORMAL HIGH (ref 4.0–10.5)
nRBC: 0 % (ref 0.0–0.2)

## 2020-02-08 LAB — I-STAT BETA HCG BLOOD, ED (MC, WL, AP ONLY): I-stat hCG, quantitative: <5 m[IU]/mL (ref <5)

## 2020-02-08 MED ORDER — SODIUM CHLORIDE 0.9 % IV BOLUS
1000.0000 mL | Freq: Once | INTRAVENOUS | Status: AC
  Administered 2020-02-08: 1000 mL via INTRAVENOUS

## 2020-02-08 MED ORDER — TAMSULOSIN HCL 0.4 MG PO CAPS: 0.4000 mg | ORAL_CAPSULE | Freq: Every day | ORAL | 0 refills | Status: DC

## 2020-02-08 MED ORDER — MORPHINE SULFATE (PF) 4 MG/ML IV SOLN
4.0000 mg | Freq: Once | INTRAVENOUS | Status: AC
  Administered 2020-02-08: 4 mg via INTRAVENOUS
  Filled 2020-02-08: qty 1

## 2020-02-08 MED ORDER — ONDANSETRON 4 MG PO TBDP: 4.0000 mg | ORAL_TABLET | Freq: Three times a day (TID) | ORAL | 0 refills | Status: DC | PRN

## 2020-02-08 MED ORDER — HYDROCODONE-ACETAMINOPHEN 5-325 MG PO TABS: 1.0000 | ORAL_TABLET | ORAL | 0 refills | Status: DC | PRN

## 2020-02-08 MED ORDER — ONDANSETRON HCL 4 MG/2ML IJ SOLN
4.0000 mg | Freq: Once | INTRAMUSCULAR | Status: AC
  Administered 2020-02-08: 4 mg via INTRAVENOUS
  Filled 2020-02-08: qty 2

## 2020-02-08 MED ORDER — KETOROLAC TROMETHAMINE 30 MG/ML IJ SOLN
30.0000 mg | Freq: Once | INTRAMUSCULAR | Status: AC
  Administered 2020-02-08: 30 mg via INTRAVENOUS
  Filled 2020-02-08: qty 1

## 2020-02-08 NOTE — ED Provider Notes (Signed)
Medical screening examination/treatment/procedure(s) were conducted as a shared visit with non-physician practitioner(s) and myself.  I personally evaluated the patient during the encounter.    44 year old female presents with left-sided flank pain.  She has evidence of left side kidney stone.  She is medicated feels better.  Will place on pain meds as well as Flomax and give referral to urology   Lorre Nick, MD 02/08/20 1455

## 2020-02-08 NOTE — ED Triage Notes (Signed)
Patient reports to the ER for groin and flank pain. Patient reports the pain started 0300 Saturday morning and has been worsening since.

## 2020-02-08 NOTE — Discharge Instructions (Signed)
Return for new or worsening symptoms

## 2020-02-08 NOTE — ED Provider Notes (Signed)
[facility]-EMERGENCY DEPT Provider Note   CSN: [id number] Arrival date & time: 02/08/20  1228    History Chief Complaint  Patient presents with  . Flank Pain    Faith Hodges is a 44 y.o. female with past medical history significant for hypercholesterolemia presents for evaluation of left flank pain.  Radiates into her left lower quadrant.  No prior history of stones.  Pain started yesterday morning.  Pain described as sharp and stabbing.  Feels like she intermittently cannot fully empty her bladder.  No dysuria.  Has had some mild blood in her urine.  No concerns for STDs.  Denies chance of pregnancy.  Has had multiple episodes of NBNB emesis.  Taking Tylenol ibuprofen without relief of her pain.  Unable to sit still.  Rates her pain a 10/10.  Denies fever, chills, chest pain, shortness of breath, urinary frequency.  Patient states she has had chronic diarrhea since she had Covid 3 weeks ago.  No melena or bright blood per rectum.  No recent antibiotics or travel.  Denies additional aggravating or alleviating factors.  History obtained from patient and past medical records.  No interpreter is used.  HPI     Past Medical History:  Diagnosis Date  . Elevated cholesterol     Patient Active Problem List   Diagnosis Date Noted  . PMS (premenstrual syndrome) 03/26/2012    Past Surgical History:  Procedure Laterality Date  . BREAST CYST ASPIRATION    . TUMOR REMOVAL     lt knee     OB History    Gravida  0   Para  0   Term  0   Preterm  0   AB  0   Living  0     SAB  0   TAB  0   Ectopic  0   Multiple  0   Live Births  0           Family History  Problem Relation Age of Onset  . Heart disease Mother     Social History   Tobacco Use  . Smoking status: Never Smoker  . Smokeless tobacco: Never Used  Substance Use Topics  . Alcohol use: No    Alcohol/week: 0.0 standard drinks  . Drug use: No    Home Medications Prior  to Admission medications   Medication Sig Start Date End Date Taking? Authorizing Provider  HYDROcodone-acetaminophen (NORCO/VICODIN) 5-325 MG tablet Take 1 tablet by mouth every 4 (four) hours as needed. 02/08/20   Saleh Ulbrich A, PA-C  ondansetron (ZOFRAN ODT) 4 MG disintegrating tablet Take 1 tablet (4 mg total) by mouth every 8 (eight) hours as needed for nausea or vomiting. 02/08/20   Lacheryl Niesen A, PA-C  tamsulosin (FLOMAX) 0.4 MG CAPS capsule Take 1 capsule (0.4 mg total) by mouth daily. 02/08/20   Aliyana Dlugosz A, PA-C    Allergies    Iodine, Shellfish allergy, and Latex  Review of Systems   Review of Systems  Constitutional: Negative.   HENT: Negative.   Respiratory: Negative.   Cardiovascular: Negative.   Gastrointestinal: Negative.   Genitourinary: Positive for flank pain. Negative for decreased urine volume, difficulty urinating, dysuria, enuresis, frequency, genital sores, hematuria, menstrual problem, pelvic pain, urgency, vaginal bleeding, vaginal discharge and vaginal pain.  Skin: Negative.   Neurological: Negative.   All other systems reviewed and are negative.   Physical Exam Updated Vital Signs BP 139/73   Pulse 90  Temp 98.6 F (37 C) (Oral)   Resp 15   Ht 5\' 6"  (1.676 m)   Wt 77.1 kg   LMP 01/13/2020   SpO2 100%   BMI 27.44 kg/m   Physical Exam Vitals and nursing note reviewed.  Constitutional:      General: She is not in acute distress.    Appearance: She is well-developed. She is not ill-appearing, toxic-appearing or diaphoretic.     Comments: Tossing and turning in pain  HENT:     Head: Normocephalic and atraumatic.     Nose: Nose normal.     Mouth/Throat:     Mouth: Mucous membranes are moist.  Eyes:     Pupils: Pupils are equal, round, and reactive to light.  Cardiovascular:     Rate and Rhythm: Normal rate.     Pulses: Normal pulses.     Heart sounds: Normal heart sounds.  Pulmonary:     Effort: Pulmonary effort is normal.  No respiratory distress.     Breath sounds: Normal breath sounds.  Abdominal:     General: Bowel sounds are normal. There is no distension.     Palpations: Abdomen is soft.     Tenderness: There is abdominal tenderness in the left upper quadrant and left lower quadrant. There is left CVA tenderness. There is no right CVA tenderness, guarding or rebound. Negative signs include Murphy's sign and McBurney's sign.     Hernia: No hernia is present.  Musculoskeletal:        General: Normal range of motion.     Cervical back: Normal range of motion.     Comments: Moves all 4 extremities at difficulty.  No bony tenderness  Skin:    General: Skin is warm and dry.     Capillary Refill: Capillary refill takes less than 2 seconds.     Comments: No edema, erythema or warmth.  No fluctuance or induration.  Neurological:     Mental Status: She is alert.     Comments: Cranial nerves II to XII grossly intact.    ED Results / Procedures / Treatments   Labs (all labs ordered are listed, but only abnormal results are displayed) Labs Reviewed  COMPREHENSIVE METABOLIC PANEL - Abnormal; Notable for the following components:      Result Value   Glucose, Bld 102 (*)    Total Protein 8.8 (*)    Total Bilirubin 1.3 (*)    All other components within normal limits  CBC - Abnormal; Notable for the following components:   WBC 11.7 (*)    All other components within normal limits  URINALYSIS, ROUTINE W REFLEX MICROSCOPIC - Abnormal; Notable for the following components:   Hgb urine dipstick MODERATE (*)    Ketones, ur 20 (*)    Bacteria, UA RARE (*)    All other components within normal limits  LIPASE, BLOOD  I-STAT BETA HCG BLOOD, ED ([facility], [facility], [facility] ONLY)    EKG None  Radiology CT Renal Stone Study  Result Date: 02/08/2020 CLINICAL DATA:  LEFT flank pain EXAM: CT ABDOMEN AND PELVIS WITHOUT CONTRAST TECHNIQUE: Multidetector CT imaging of the abdomen and pelvis was performed following the standard  protocol without IV contrast. COMPARISON:  December 25, 2008. FINDINGS: Lower chest: No acute abnormality. Hepatobiliary: Unremarkable noncontrast appearance of the liver. Possible layering gallstones. No evidence of acute cholecystitis. No extrahepatic biliary ductal dilation. Pancreas: No peripancreatic inflammation. Spleen: Spleen is unremarkable. Adrenals/Urinary Tract: There is a 3 mm urolithiasis within the LEFT  UVJ. There is minimal upstream hydroureteronephrosis with adjacent fat stranding. There is a punctate nonobstructive RIGHT-sided nephrolithiasis. Bladder is otherwise unremarkable. Adrenal glands are unremarkable. Stomach/Bowel: Stomach is within normal limits. Appendix appears normal. No evidence of bowel wall thickening, distention, or inflammatory changes. Vascular/Lymphatic: No significant vascular findings are present. No enlarged abdominal or pelvic lymph nodes. Reproductive: Uterus is unremarkable. LEFT adnexal dominant follicle versus 15 mm ovarian cyst. Other: Trace pelvic free fluid, likely physiologic. Musculoskeletal: No acute or significant osseous findings. IMPRESSION: 1. There is a 3 mm urolithiasis within the LEFT UVJ with minimal upstream hydroureteronephrosis and fat stranding adjacent to the ureter, likely reactive. Electronically Signed   By: Meda Klinefelter MD   On: 02/08/2020 14:42    Procedures Procedures (including critical care time)  Medications Ordered in ED Medications  ondansetron [facility]) injection 4 mg (4 mg Intravenous Given 02/08/20 1326)  morphine 4 MG/ML injection 4 mg (4 mg Intravenous Given 02/08/20 1326)  sodium chloride 0.9 % bolus 1,000 mL (0 mLs Intravenous Stopped 02/08/20 1558)  ketorolac (TORADOL) 30 MG/ML injection 30 mg (30 mg Intravenous Given 02/08/20 1600)   ED Course  I have reviewed the triage vital signs and the nursing notes.  Pertinent labs & imaging results that were available during my care of the patient were reviewed by me and  considered in my medical decision making (see chart for details).  45 year old presents for evaluation of left flank pain.  She is afebrile, nonseptic, non-ill-appearing.  Appears uncomfortable on exam.  No prior history of stones however has had hematuria.  She has been able to fully empty her bladder here in the emergency department.  Heart lungs clear.  Abdomen soft.  Neurovascularly intact.  Plan labs, imaging and reassess  Labs and imaging personally reviewed and interpreted:  CBC with mild leukocytosis at 11.7 Lipase 33 Pregnancy test negative Metabolic panel with mild hyperglycemia to 102, mild elevation T bili at 1.3 however no additional LFT elevation.  She has negative Murphy sign and low suspicion for acute cholecystitis, obstructing stone UA with blood however does not appear infected CT Stone with 71mm urolithiasis at left UVJ. Minimal upstream hydronephrosis  Patient reassessed.  Pain controlled.  Tolerating p.o. intake without difficulty.  Requesting DC home.  Discussed symptomatic management.  Will DC home with short course of pain medicine, Flomax, antiemetics.  She will return for any worsening symptoms.  The patient has been appropriately medically screened and/or stabilized in the ED. I have low suspicion for any other emergent medical condition which would require further screening, evaluation or treatment in the ED or require inpatient management.  Patient is hemodynamically stable and in no acute distress.  Patient able to ambulate in department prior to ED.  Evaluation does not show acute pathology that would require ongoing or additional emergent interventions while in the emergency department or further inpatient treatment.  I have discussed the diagnosis with the patient and answered all questions.  Pain is been managed while in the emergency department and patient has no further complaints prior to discharge.  Patient is comfortable with plan discussed in room and is  stable for discharge at this time.  I have discussed strict return precautions for returning to the emergency department.  Patient was encouraged to follow-up with PCP/specialist refer to at discharge.    MDM Rules/Calculators/A&P                           Final  Clinical Impression(s) / ED Diagnoses Final diagnoses:  Nephrolithiasis    Rx / DC Orders ED Discharge Orders         Ordered    ondansetron (ZOFRAN ODT) 4 MG disintegrating tablet  Every 8 hours PRN        02/08/20 1600    tamsulosin (FLOMAX) 0.4 MG CAPS capsule  Daily        02/08/20 1601    HYDROcodone-acetaminophen (NORCO/VICODIN) 5-325 MG tablet  Every 4 hours PRN        02/08/20 1605           Torey Regan A, PA-C 02/08/20 1704    Lorre Nick, MD 02/13/20 1353

## 2020-02-12 DIAGNOSIS — N200 Calculus of kidney: Secondary | ICD-10-CM | POA: Diagnosis not present

## 2020-02-23 DIAGNOSIS — M9905 Segmental and somatic dysfunction of pelvic region: Secondary | ICD-10-CM | POA: Diagnosis not present

## 2020-02-23 DIAGNOSIS — M25552 Pain in left hip: Secondary | ICD-10-CM | POA: Diagnosis not present

## 2020-02-23 DIAGNOSIS — M9903 Segmental and somatic dysfunction of lumbar region: Secondary | ICD-10-CM | POA: Diagnosis not present

## 2020-02-23 DIAGNOSIS — M5137 Other intervertebral disc degeneration, lumbosacral region: Secondary | ICD-10-CM | POA: Diagnosis not present

## 2020-03-10 DIAGNOSIS — Z1231 Encounter for screening mammogram for malignant neoplasm of breast: Secondary | ICD-10-CM | POA: Diagnosis not present

## 2020-03-10 DIAGNOSIS — Z01419 Encounter for gynecological examination (general) (routine) without abnormal findings: Secondary | ICD-10-CM | POA: Diagnosis not present

## 2020-03-10 DIAGNOSIS — Z3040 Encounter for surveillance of contraceptives, unspecified: Secondary | ICD-10-CM | POA: Diagnosis not present

## 2020-03-10 DIAGNOSIS — Z6827 Body mass index (BMI) 27.0-27.9, adult: Secondary | ICD-10-CM | POA: Diagnosis not present

## 2020-03-10 DIAGNOSIS — R1032 Left lower quadrant pain: Secondary | ICD-10-CM | POA: Diagnosis not present

## 2020-03-23 DIAGNOSIS — R1032 Left lower quadrant pain: Secondary | ICD-10-CM | POA: Diagnosis not present

## 2020-04-30 DIAGNOSIS — E559 Vitamin D deficiency, unspecified: Secondary | ICD-10-CM | POA: Diagnosis not present

## 2020-04-30 DIAGNOSIS — Z0000 Encounter for general adult medical examination without abnormal findings: Secondary | ICD-10-CM | POA: Diagnosis not present

## 2020-04-30 DIAGNOSIS — E7800 Pure hypercholesterolemia, unspecified: Secondary | ICD-10-CM | POA: Diagnosis not present

## 2020-05-03 DIAGNOSIS — Z0000 Encounter for general adult medical examination without abnormal findings: Secondary | ICD-10-CM | POA: Diagnosis not present

## 2020-08-09 ENCOUNTER — Ambulatory Visit: Payer: BC Managed Care – PPO | Admitting: Family Medicine

## 2020-08-16 ENCOUNTER — Encounter: Payer: Self-pay | Admitting: Family Medicine

## 2020-08-16 ENCOUNTER — Ambulatory Visit: Payer: BC Managed Care – PPO | Admitting: Family Medicine

## 2020-08-16 ENCOUNTER — Other Ambulatory Visit: Payer: Self-pay

## 2020-08-16 DIAGNOSIS — M25511 Pain in right shoulder: Secondary | ICD-10-CM

## 2020-08-16 MED ORDER — MELOXICAM 15 MG PO TABS: 7.5000 mg | ORAL_TABLET | Freq: Every day | ORAL | 6 refills | Status: AC | PRN

## 2020-08-16 NOTE — Progress Notes (Signed)
   Office Visit Note   Patient: Faith Hodges           Date of Birth: October 08, 1975           MRN: [id number] Visit Date: 08/16/2020 Requested by: [facility] [address] [place],  [place] [address] PCP: [facility]  Subjective: Chief Complaint  Patient presents with  . Right Shoulder - Pain    Pain deep in the shoulder x 3-4 months - started after lifting the cage her 16-lb dog was lying in - there was a "slight jerk" type movement. Pain wakes her at night. Can raise her arm up, but it hurts.    HPI: She is here with right shoulder pain.  Symptoms started about 3 or 4 months ago, she was lifting the dog cage containing her 16 pound dog.  The cage jerked slightly and she felt a twinge of pain in her shoulder.  Later that day the pain became more pronounced and she has continued to have problems with her shoulder since then.  It feels better when doing her Pilates workouts, but worse later in the day.  She is right-hand dominant.  She has not taken medications for her pain.              ROS:   All other systems were reviewed and are negative.  Objective: Vital Signs: There were no vitals taken for this visit.  Physical Exam:  General:  Alert and oriented, in no acute distress. Pulm:  Breathing unlabored. Psy:  Normal mood, congruent affect  Right shoulder: Full active range of motion, no adhesive capsulitis.  There is some pain with passive abduction and external rotation.  She is tender in the posterior subacromial space.  No tenderness at the [facility] joint or the long head biceps tendon.  Empty can test is slightly painful but rotator cuff strength is 5/5.  She has mild pain with external rotation against resistance.  Speeds test is negative.  Imaging: No results found.  Assessment & Plan: 1.  Right shoulder impingement, cannot rule out partial rotator cuff tear. -Elected to try home exercises, meloxicam, ice after  activity.  If symptoms persist we could either try physical therapy, a subacromial injection, or proceed with x-rays and either ultrasound or MRI imaging.     Procedures: No procedures performed        PMFS History: Patient Active Problem List   Diagnosis Date Noted  . PMS (premenstrual syndrome) 03/26/2012   Past Medical History:  Diagnosis Date  . Elevated cholesterol     Family History  Problem Relation Age of Onset  . Heart disease Mother     Past Surgical History:  Procedure Laterality Date  . BREAST CYST ASPIRATION    . TUMOR REMOVAL     lt knee   Social History   Occupational History  . Not on file  Tobacco Use  . Smoking status: Never Smoker  . Smokeless tobacco: Never Used  Substance and Sexual Activity  . Alcohol use: No    Alcohol/week: 0.0 standard drinks  . Drug use: No  . Sexual activity: Yes    Birth control/protection: None

## 2020-08-26 DIAGNOSIS — N6002 Solitary cyst of left breast: Secondary | ICD-10-CM | POA: Diagnosis not present

## 2020-08-27 ENCOUNTER — Other Ambulatory Visit: Payer: Self-pay | Admitting: Obstetrics and Gynecology

## 2020-08-27 DIAGNOSIS — N6002 Solitary cyst of left breast: Secondary | ICD-10-CM

## 2020-08-30 ENCOUNTER — Ambulatory Visit
Admission: RE | Admit: 2020-08-30 | Discharge: 2020-08-30 | Disposition: A | Discharge status: Home / Self Care | Payer: BC Managed Care – PPO | Source: Ambulatory Visit | Attending: Obstetrics and Gynecology | Admitting: Obstetrics and Gynecology

## 2020-08-30 ENCOUNTER — Other Ambulatory Visit: Payer: Self-pay

## 2020-08-30 DIAGNOSIS — N6002 Solitary cyst of left breast: Secondary | ICD-10-CM

## 2020-08-30 DIAGNOSIS — R922 Inconclusive mammogram: Secondary | ICD-10-CM | POA: Diagnosis not present

## 2020-08-30 DIAGNOSIS — N644 Mastodynia: Secondary | ICD-10-CM | POA: Diagnosis not present

## 2020-09-05 LAB — AEROBIC/ANAEROBIC CULTURE W GRAM STAIN (SURGICAL/DEEP WOUND): Culture: NO GROWTH

## 2020-09-29 DIAGNOSIS — N3001 Acute cystitis with hematuria: Secondary | ICD-10-CM | POA: Diagnosis not present

## 2020-10-11 DIAGNOSIS — D239 Other benign neoplasm of skin, unspecified: Secondary | ICD-10-CM | POA: Diagnosis not present

## 2020-10-11 DIAGNOSIS — L739 Follicular disorder, unspecified: Secondary | ICD-10-CM | POA: Diagnosis not present

## 2020-10-11 DIAGNOSIS — L910 Hypertrophic scar: Secondary | ICD-10-CM | POA: Diagnosis not present

## 2020-10-19 DIAGNOSIS — R922 Inconclusive mammogram: Secondary | ICD-10-CM | POA: Diagnosis not present

## 2020-10-19 DIAGNOSIS — N644 Mastodynia: Secondary | ICD-10-CM | POA: Diagnosis not present

## 2020-10-19 DIAGNOSIS — N6001 Solitary cyst of right breast: Secondary | ICD-10-CM | POA: Diagnosis not present

## 2020-10-20 ENCOUNTER — Other Ambulatory Visit: Payer: Self-pay | Admitting: Obstetrics and Gynecology

## 2020-10-20 DIAGNOSIS — N6001 Solitary cyst of right breast: Secondary | ICD-10-CM

## 2020-11-02 DIAGNOSIS — D2362 Other benign neoplasm of skin of left upper limb, including shoulder: Secondary | ICD-10-CM | POA: Diagnosis not present

## 2020-11-02 DIAGNOSIS — D239 Other benign neoplasm of skin, unspecified: Secondary | ICD-10-CM | POA: Diagnosis not present

## 2020-11-17 DIAGNOSIS — R102 Pelvic and perineal pain: Secondary | ICD-10-CM | POA: Diagnosis not present

## 2020-11-17 DIAGNOSIS — R3129 Other microscopic hematuria: Secondary | ICD-10-CM | POA: Diagnosis not present

## 2020-11-17 DIAGNOSIS — N760 Acute vaginitis: Secondary | ICD-10-CM | POA: Diagnosis not present

## 2020-11-23 ENCOUNTER — Other Ambulatory Visit: Payer: BC Managed Care – PPO

## 2020-12-08 ENCOUNTER — Other Ambulatory Visit: Payer: BC Managed Care – PPO

## 2021-01-05 ENCOUNTER — Telehealth: Payer: Self-pay | Admitting: Family Medicine

## 2021-01-05 NOTE — Telephone Encounter (Signed)
I called. She was actually calling about her mother, Arie Sabina. Please see chart [id number].

## 2021-01-05 NOTE — Telephone Encounter (Signed)
Pt calling about medication equipment prescription she needed but states she is a Dr. Prince Rome previous pt. The best call number is [phone].

## 2021-02-09 ENCOUNTER — Other Ambulatory Visit: Payer: Self-pay | Admitting: Obstetrics and Gynecology

## 2021-02-09 DIAGNOSIS — N6001 Solitary cyst of right breast: Secondary | ICD-10-CM

## 2021-02-11 ENCOUNTER — Ambulatory Visit
Admission: RE | Admit: 2021-02-11 | Discharge: 2021-02-11 | Disposition: A | Discharge status: Home / Self Care | Payer: BC Managed Care – PPO | Source: Ambulatory Visit | Attending: Obstetrics and Gynecology | Admitting: Obstetrics and Gynecology

## 2021-02-11 DIAGNOSIS — R922 Inconclusive mammogram: Secondary | ICD-10-CM | POA: Diagnosis not present

## 2021-02-11 DIAGNOSIS — N6001 Solitary cyst of right breast: Secondary | ICD-10-CM

## 2021-02-15 DIAGNOSIS — Z1211 Encounter for screening for malignant neoplasm of colon: Secondary | ICD-10-CM | POA: Diagnosis not present

## 2021-02-15 DIAGNOSIS — Z800 Family history of malignant neoplasm of digestive organs: Secondary | ICD-10-CM | POA: Diagnosis not present

## 2021-02-24 DIAGNOSIS — F411 Generalized anxiety disorder: Secondary | ICD-10-CM | POA: Diagnosis not present

## 2021-02-28 DIAGNOSIS — F411 Generalized anxiety disorder: Secondary | ICD-10-CM | POA: Diagnosis not present

## 2021-03-01 ENCOUNTER — Other Ambulatory Visit: Payer: Self-pay | Admitting: Obstetrics and Gynecology

## 2021-03-01 DIAGNOSIS — R928 Other abnormal and inconclusive findings on diagnostic imaging of breast: Secondary | ICD-10-CM

## 2021-03-11 DIAGNOSIS — N6009 Solitary cyst of unspecified breast: Secondary | ICD-10-CM | POA: Diagnosis not present

## 2021-03-11 DIAGNOSIS — F430 Acute stress reaction: Secondary | ICD-10-CM | POA: Diagnosis not present

## 2021-03-14 DIAGNOSIS — N6001 Solitary cyst of right breast: Secondary | ICD-10-CM | POA: Diagnosis not present

## 2021-03-14 DIAGNOSIS — Z9289 Personal history of other medical treatment: Secondary | ICD-10-CM | POA: Diagnosis not present

## 2021-03-14 DIAGNOSIS — F411 Generalized anxiety disorder: Secondary | ICD-10-CM | POA: Diagnosis not present

## 2021-03-30 DIAGNOSIS — F411 Generalized anxiety disorder: Secondary | ICD-10-CM | POA: Diagnosis not present

## 2021-04-11 DIAGNOSIS — F411 Generalized anxiety disorder: Secondary | ICD-10-CM | POA: Diagnosis not present

## 2021-04-12 ENCOUNTER — Other Ambulatory Visit: Payer: BC Managed Care – PPO

## 2021-04-25 DIAGNOSIS — L299 Pruritus, unspecified: Secondary | ICD-10-CM | POA: Diagnosis not present

## 2021-04-25 DIAGNOSIS — R102 Pelvic and perineal pain: Secondary | ICD-10-CM | POA: Diagnosis not present

## 2021-04-25 DIAGNOSIS — N898 Other specified noninflammatory disorders of vagina: Secondary | ICD-10-CM | POA: Diagnosis not present

## 2021-04-28 DIAGNOSIS — F411 Generalized anxiety disorder: Secondary | ICD-10-CM | POA: Diagnosis not present

## 2021-05-19 DIAGNOSIS — F411 Generalized anxiety disorder: Secondary | ICD-10-CM | POA: Diagnosis not present

## 2021-05-30 DIAGNOSIS — F411 Generalized anxiety disorder: Secondary | ICD-10-CM | POA: Diagnosis not present

## 2021-06-13 DIAGNOSIS — F411 Generalized anxiety disorder: Secondary | ICD-10-CM | POA: Diagnosis not present

## 2021-07-25 DIAGNOSIS — F411 Generalized anxiety disorder: Secondary | ICD-10-CM | POA: Diagnosis not present

## 2021-08-10 DIAGNOSIS — L910 Hypertrophic scar: Secondary | ICD-10-CM | POA: Diagnosis not present

## 2021-08-10 DIAGNOSIS — D224 Melanocytic nevi of scalp and neck: Secondary | ICD-10-CM | POA: Diagnosis not present

## 2021-08-11 DIAGNOSIS — B379 Candidiasis, unspecified: Secondary | ICD-10-CM | POA: Diagnosis not present

## 2021-08-11 DIAGNOSIS — H5789 Other specified disorders of eye and adnexa: Secondary | ICD-10-CM | POA: Diagnosis not present

## 2021-08-16 DIAGNOSIS — F411 Generalized anxiety disorder: Secondary | ICD-10-CM | POA: Diagnosis not present

## 2021-09-01 DIAGNOSIS — F411 Generalized anxiety disorder: Secondary | ICD-10-CM | POA: Diagnosis not present

## 2021-09-12 DIAGNOSIS — N951 Menopausal and female climacteric states: Secondary | ICD-10-CM | POA: Diagnosis not present

## 2021-09-12 DIAGNOSIS — F4389 Other reactions to severe stress: Secondary | ICD-10-CM | POA: Diagnosis not present

## 2021-09-12 DIAGNOSIS — Z7189 Other specified counseling: Secondary | ICD-10-CM | POA: Diagnosis not present

## 2021-09-14 DIAGNOSIS — F411 Generalized anxiety disorder: Secondary | ICD-10-CM | POA: Diagnosis not present

## 2021-10-06 DIAGNOSIS — F411 Generalized anxiety disorder: Secondary | ICD-10-CM | POA: Diagnosis not present

## 2021-10-18 DIAGNOSIS — E7800 Pure hypercholesterolemia, unspecified: Secondary | ICD-10-CM | POA: Diagnosis not present

## 2021-10-18 DIAGNOSIS — E559 Vitamin D deficiency, unspecified: Secondary | ICD-10-CM | POA: Diagnosis not present

## 2021-10-20 DIAGNOSIS — Z0000 Encounter for general adult medical examination without abnormal findings: Secondary | ICD-10-CM | POA: Diagnosis not present

## 2021-10-24 DIAGNOSIS — F411 Generalized anxiety disorder: Secondary | ICD-10-CM | POA: Diagnosis not present

## 2021-11-01 DIAGNOSIS — Z124 Encounter for screening for malignant neoplasm of cervix: Secondary | ICD-10-CM | POA: Diagnosis not present

## 2021-11-01 DIAGNOSIS — Z6827 Body mass index (BMI) 27.0-27.9, adult: Secondary | ICD-10-CM | POA: Diagnosis not present

## 2021-11-01 DIAGNOSIS — Z01419 Encounter for gynecological examination (general) (routine) without abnormal findings: Secondary | ICD-10-CM | POA: Diagnosis not present

## 2021-11-01 DIAGNOSIS — Z1151 Encounter for screening for human papillomavirus (HPV): Secondary | ICD-10-CM | POA: Diagnosis not present

## 2021-11-02 DIAGNOSIS — M25552 Pain in left hip: Secondary | ICD-10-CM | POA: Diagnosis not present

## 2021-11-14 DIAGNOSIS — F411 Generalized anxiety disorder: Secondary | ICD-10-CM | POA: Diagnosis not present

## 2021-11-18 DIAGNOSIS — M25552 Pain in left hip: Secondary | ICD-10-CM | POA: Diagnosis not present

## 2021-12-05 DIAGNOSIS — F411 Generalized anxiety disorder: Secondary | ICD-10-CM | POA: Diagnosis not present

## 2021-12-28 DIAGNOSIS — F411 Generalized anxiety disorder: Secondary | ICD-10-CM | POA: Diagnosis not present

## 2021-12-30 DIAGNOSIS — Z1211 Encounter for screening for malignant neoplasm of colon: Secondary | ICD-10-CM | POA: Diagnosis not present

## 2022-01-03 DIAGNOSIS — F4381 Prolonged grief disorder: Secondary | ICD-10-CM | POA: Diagnosis not present

## 2022-01-03 DIAGNOSIS — F430 Acute stress reaction: Secondary | ICD-10-CM | POA: Diagnosis not present

## 2022-01-09 DIAGNOSIS — F411 Generalized anxiety disorder: Secondary | ICD-10-CM | POA: Diagnosis not present

## 2022-01-25 DIAGNOSIS — F411 Generalized anxiety disorder: Secondary | ICD-10-CM | POA: Diagnosis not present

## 2022-02-01 DIAGNOSIS — F411 Generalized anxiety disorder: Secondary | ICD-10-CM | POA: Diagnosis not present

## 2022-02-13 DIAGNOSIS — F411 Generalized anxiety disorder: Secondary | ICD-10-CM | POA: Diagnosis not present

## 2022-02-27 DIAGNOSIS — F411 Generalized anxiety disorder: Secondary | ICD-10-CM | POA: Diagnosis not present

## 2022-03-13 DIAGNOSIS — F411 Generalized anxiety disorder: Secondary | ICD-10-CM | POA: Diagnosis not present

## 2022-03-31 DIAGNOSIS — F411 Generalized anxiety disorder: Secondary | ICD-10-CM | POA: Diagnosis not present

## 2022-04-10 DIAGNOSIS — M25552 Pain in left hip: Secondary | ICD-10-CM | POA: Diagnosis not present

## 2022-05-30 DIAGNOSIS — F411 Generalized anxiety disorder: Secondary | ICD-10-CM | POA: Diagnosis not present

## 2022-08-01 ENCOUNTER — Other Ambulatory Visit: Payer: Self-pay | Admitting: Family Medicine

## 2022-08-01 DIAGNOSIS — N6310 Unspecified lump in the right breast, unspecified quadrant: Secondary | ICD-10-CM

## 2022-09-04 DIAGNOSIS — F411 Generalized anxiety disorder: Secondary | ICD-10-CM | POA: Diagnosis not present

## 2022-09-11 DIAGNOSIS — Z008 Encounter for other general examination: Secondary | ICD-10-CM | POA: Diagnosis not present

## 2022-09-11 DIAGNOSIS — R5383 Other fatigue: Secondary | ICD-10-CM | POA: Diagnosis not present

## 2022-09-11 DIAGNOSIS — E559 Vitamin D deficiency, unspecified: Secondary | ICD-10-CM | POA: Diagnosis not present
